# Patient Record
Sex: Female | Born: 1995 | Race: White | Hispanic: No | Marital: Single | Smoking: Current every day smoker
Health system: Southern US, Community
[De-identification: ages and names within clinical notes are randomized; demographics above are authoritative.]

## PROBLEM LIST (undated history)

## (undated) ENCOUNTER — Emergency Department (HOSPITAL_COMMUNITY): Admission: EM | Payer: MEDICAID | Source: Home / Self Care

## (undated) ENCOUNTER — Inpatient Hospital Stay (HOSPITAL_COMMUNITY): Payer: Self-pay

## (undated) DIAGNOSIS — R569 Unspecified convulsions: Secondary | ICD-10-CM

## (undated) DIAGNOSIS — J020 Streptococcal pharyngitis: Secondary | ICD-10-CM

## (undated) HISTORY — DX: Streptococcal pharyngitis: J02.0

## (undated) HISTORY — PX: NO PAST SURGERIES: SHX2092

## (undated) HISTORY — PX: OTHER SURGICAL HISTORY: SHX169

---
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
## 2015-01-09 DIAGNOSIS — F1910 Other psychoactive substance abuse, uncomplicated: Secondary | ICD-10-CM | POA: Insufficient documentation

## 2015-01-09 DIAGNOSIS — T1491XA Suicide attempt, initial encounter: Secondary | ICD-10-CM | POA: Insufficient documentation

## 2015-11-28 LAB — OB RESULTS CONSOLE GC/CHLAMYDIA
Chlamydia: NEGATIVE
Gonorrhea: NEGATIVE

## 2015-11-30 NOTE — L&D Delivery Note (Signed)
Delivery Note At 6:15 PM a viable female was delivered via Vaginal, Spontaneous Delivery (Presentation: ROA).  APGAR: 8, 9; weight pending.   Placenta status: intact, spontaneous.  Cord: 3 vessel. with the following complications: none.   Anesthesia: epidural Episiotomy: None Lacerations: Labial Suture Repair: 3.0 vicryl vicryl rapide Est. Blood Loss (mL):  Mom to postpartum.  Baby to Couplet care / Skin to Skin.  Ashley Harvey 06/28/2016, 6:55 PM  OB FELLOW DELIVERY ATTESTATION  I was gloved and present for the delivery in its entirety, and I agree with the above resident's note.    Ernestina Penna, MD 7:04 PM

## 2015-12-05 ENCOUNTER — Encounter (HOSPITAL_COMMUNITY): Payer: Self-pay

## 2015-12-05 ENCOUNTER — Inpatient Hospital Stay (HOSPITAL_COMMUNITY)
Admission: AD | Admit: 2015-12-05 | Discharge: 2015-12-10 | DRG: 781 | Disposition: A | Discharge status: Home / Self Care | Payer: BC Managed Care – PPO | Source: Ambulatory Visit | Attending: Obstetrics and Gynecology | Admitting: Obstetrics and Gynecology

## 2015-12-05 DIAGNOSIS — O99281 Endocrine, nutritional and metabolic diseases complicating pregnancy, first trimester: Secondary | ICD-10-CM | POA: Diagnosis present

## 2015-12-05 DIAGNOSIS — O210 Mild hyperemesis gravidarum: Secondary | ICD-10-CM | POA: Diagnosis present

## 2015-12-05 DIAGNOSIS — Z87891 Personal history of nicotine dependence: Secondary | ICD-10-CM

## 2015-12-05 DIAGNOSIS — E876 Hypokalemia: Secondary | ICD-10-CM | POA: Diagnosis present

## 2015-12-05 DIAGNOSIS — E860 Dehydration: Secondary | ICD-10-CM | POA: Diagnosis present

## 2015-12-05 DIAGNOSIS — Z6824 Body mass index (BMI) 24.0-24.9, adult: Secondary | ICD-10-CM

## 2015-12-05 DIAGNOSIS — E43 Unspecified severe protein-calorie malnutrition: Secondary | ICD-10-CM | POA: Insufficient documentation

## 2015-12-05 DIAGNOSIS — O2511 Malnutrition in pregnancy, first trimester: Principal | ICD-10-CM | POA: Diagnosis present

## 2015-12-05 DIAGNOSIS — Z3A09 9 weeks gestation of pregnancy: Secondary | ICD-10-CM

## 2015-12-05 DIAGNOSIS — R569 Unspecified convulsions: Secondary | ICD-10-CM | POA: Diagnosis present

## 2015-12-05 HISTORY — DX: Unspecified convulsions: R56.9

## 2015-12-05 LAB — LIPASE, BLOOD: Lipase: 27 U/L (ref 11–51)

## 2015-12-05 LAB — CBC WITH DIFFERENTIAL/PLATELET
BASOS ABS: 0 10*3/uL (ref 0.0–0.1)
BASOS PCT: 0 %
Eosinophils Absolute: 0 10*3/uL (ref 0.0–0.7)
Eosinophils Relative: 0 %
HEMATOCRIT: 42.1 % (ref 36.0–46.0)
HEMOGLOBIN: 15.1 g/dL — AB (ref 12.0–15.0)
LYMPHS PCT: 13 %
Lymphs Abs: 2.9 10*3/uL (ref 0.7–4.0)
MCH: 32.6 pg (ref 26.0–34.0)
MCHC: 35.9 g/dL (ref 30.0–36.0)
MCV: 90.9 fL (ref 78.0–100.0)
MONOS PCT: 5 %
Monocytes Absolute: 1.2 10*3/uL — ABNORMAL HIGH (ref 0.1–1.0)
NEUTROS ABS: 18.1 10*3/uL — AB (ref 1.7–7.7)
NEUTROS PCT: 82 %
Platelets: 366 10*3/uL (ref 150–400)
RBC: 4.63 MIL/uL (ref 3.87–5.11)
RDW: 13.3 % (ref 11.5–15.5)
WBC: 22.2 10*3/uL — ABNORMAL HIGH (ref 4.0–10.5)

## 2015-12-05 LAB — COMPREHENSIVE METABOLIC PANEL
ALBUMIN: 4.9 g/dL (ref 3.5–5.0)
ALK PHOS: 52 U/L (ref 38–126)
ALT: 25 U/L (ref 14–54)
AST: 32 U/L (ref 15–41)
Anion gap: 14 (ref 5–15)
BILIRUBIN TOTAL: 0.8 mg/dL (ref 0.3–1.2)
BUN: 11 mg/dL (ref 6–20)
CALCIUM: 10 mg/dL (ref 8.9–10.3)
CO2: 26 mmol/L (ref 22–32)
Chloride: 98 mmol/L — ABNORMAL LOW (ref 101–111)
Creatinine, Ser: 0.71 mg/dL (ref 0.44–1.00)
GFR calc Af Amer: >60 mL/min (ref >60)
GLUCOSE: 117 mg/dL — AB (ref 65–99)
Potassium: 3.6 mmol/L (ref 3.5–5.1)
Sodium: 138 mmol/L (ref 135–145)
Total Protein: 8.7 g/dL — ABNORMAL HIGH (ref 6.5–8.1)

## 2015-12-05 LAB — HCG, QUANTITATIVE, PREGNANCY: HCG, BETA CHAIN, QUANT, S: 147624 m[IU]/mL — AB (ref <5)

## 2015-12-05 LAB — AMYLASE: Amylase: 42 U/L (ref 28–100)

## 2015-12-05 MED ORDER — METHYLPREDNISOLONE 4 MG PO TABS
8.0000 mg | ORAL_TABLET | Freq: Every day | ORAL | Status: DC
  Administered 2015-12-09 – 2015-12-10 (×2): 8 mg via ORAL
  Filled 2015-12-05 (×3): qty 2

## 2015-12-05 MED ORDER — PROMETHAZINE HCL 25 MG/ML IJ SOLN
25.0000 mg | Freq: Once | INTRAMUSCULAR | Status: AC
  Administered 2015-12-05: 25 mg via INTRAVENOUS
  Filled 2015-12-05: qty 1

## 2015-12-05 MED ORDER — METHYLPREDNISOLONE 4 MG PO TABS: 4.0000 mg | ORAL_TABLET | Freq: Every day | ORAL | Status: DC

## 2015-12-05 MED ORDER — METHYLPREDNISOLONE 4 MG PO TABS
8.0000 mg | ORAL_TABLET | Freq: Every day | ORAL | Status: DC
  Administered 2015-12-10: 8 mg via ORAL
  Filled 2015-12-05 (×2): qty 2

## 2015-12-05 MED ORDER — METHYLPREDNISOLONE 4 MG PO TABS
8.0000 mg | ORAL_TABLET | Freq: Every day | ORAL | Status: DC
  Administered 2015-12-09: 8 mg via ORAL
  Filled 2015-12-05 (×2): qty 2

## 2015-12-05 MED ORDER — KCL IN DEXTROSE-NACL 10-5-0.45 MEQ/L-%-% IV SOLN
INTRAVENOUS | Status: DC
  Administered 2015-12-05 – 2015-12-08 (×9): via INTRAVENOUS
  Filled 2015-12-05 (×18): qty 1000

## 2015-12-05 MED ORDER — ACETAMINOPHEN 325 MG PO TABS: 650.0000 mg | ORAL_TABLET | ORAL | Status: DC | PRN

## 2015-12-05 MED ORDER — ALUM & MAG HYDROXIDE-SIMETH 200-200-20 MG/5ML PO SUSP: 30.0000 mL | ORAL | Status: DC | PRN

## 2015-12-05 MED ORDER — METHYLPREDNISOLONE 16 MG PO TABS
16.0000 mg | ORAL_TABLET | Freq: Every day | ORAL | Status: AC
  Filled 2015-12-05 (×3): qty 1

## 2015-12-05 MED ORDER — METHYLPREDNISOLONE 16 MG PO TABS
16.0000 mg | ORAL_TABLET | Freq: Every day | ORAL | Status: AC
  Filled 2015-12-05 (×2): qty 1

## 2015-12-05 MED ORDER — METHYLPREDNISOLONE SODIUM SUCC 125 MG IJ SOLR
48.0000 mg | Freq: Once | INTRAMUSCULAR | Status: AC
  Administered 2015-12-05: 48 mg via INTRAVENOUS
  Filled 2015-12-05: qty 0.77

## 2015-12-05 MED ORDER — METHYLPREDNISOLONE 4 MG PO TABS
4.0000 mg | ORAL_TABLET | Freq: Every day | ORAL | Status: DC
  Filled 2015-12-05: qty 1

## 2015-12-05 MED ORDER — KCL IN DEXTROSE-NACL 10-5-0.45 MEQ/L-%-% IV SOLN: INTRAVENOUS | Status: DC

## 2015-12-05 MED ORDER — LACTATED RINGERS IV SOLN
INTRAVENOUS | Status: DC
  Administered 2015-12-05: 19:00:00 via INTRAVENOUS

## 2015-12-05 MED ORDER — METHYLPREDNISOLONE 16 MG PO TABS
16.0000 mg | ORAL_TABLET | Freq: Every day | ORAL | Status: AC
  Filled 2015-12-05 (×4): qty 1

## 2015-12-05 MED ORDER — PROMETHAZINE HCL 25 MG/ML IJ SOLN
12.5000 mg | INTRAMUSCULAR | Status: DC | PRN
  Administered 2015-12-05 – 2015-12-07 (×5): 12.5 mg via INTRAVENOUS
  Filled 2015-12-05 (×5): qty 1

## 2015-12-05 NOTE — MAU Provider Note (Signed)
Chief Complaint: Seizures and Hyperemesis Gravidarum   First Provider Initiated Contact with Patient 12/05/15 1928        SUBJECTIVE HPI  Ashley Harvey is a 20 y.o. G1P0 at [redacted]w[redacted]d by LMP who presents to maternity admissions reporting Vomiting and possible seizures.  Marland KitchenHas been [facility] and had an [place] which showed a SIUP with heartbeat. Has been on Diclegis, Phenergan and Zofran with no relief. Stated to RN that she has been having Seizures for 2 mos  Has had a workup at [facility] including Head CT and EEG.  RN states she had a seizure-like episode on admission but was NOT postictal at all afterward, totally coherent and aware. She denies vaginal bleeding, vaginal itching/burning, urinary symptoms, h/a, dizziness, or fever/chills.    Hx is remarkable for Polysubstance abuse (MJ and Triple C's (Coricidin)) and recent admission to [facility] for suicide attempt. States her mom "is very happy about the baby" and her estranged boyfriend "is going to support [place]".   RN Note: Patient found out she was pregnant in Rehab for suicide attempt, having seizures x 2 months has not seen a neurologist yet, patient had seizure in lobby in wheelchair was brought back to room 10 in MAU, hyperemesis has not been able to keep anything down for 3 days.   Past Medical History  Diagnosis Date  . Seizures (HCC)    History reviewed. No pertinent past surgical history. Social History   Social History  . Marital Status: Single    Spouse Name: N/A  . Number of Children: N/A  . Years of Education: N/A   Occupational History  . Not on file.   Social History Main Topics  . Smoking status: Former Games developer  . Smokeless tobacco: Never Used  . Alcohol Use: Not on file  . Drug Use: No  . Sexual Activity: Not Currently    Birth Control/ Protection: None   Other Topics Concern  . Not on file   Social History Narrative  . No narrative on file   No current facility-administered medications on file  prior to encounter.   No current outpatient prescriptions on file prior to encounter.   Allergies  Allergen Reactions  . Vancomycin Anaphylaxis    ROS:  Review of Systems Review of Systems  Constitutional: Negative for fever and chills.  Gastrointestinal: Negative for diarrhea and constipation.   + for N/V and diffuse abdominal pain. Genitourinary: Negative for dysuria.  Musculoskeletal: Negative for back pain.  Neurological: Negative for dizziness and weakness.    I have reviewed patient's Past Medical Hx, Surgical Hx, Family Hx, Social Hx, medications and allergies.   Physical Exam  Patient Vitals for the past 24 hrs:  BP Temp Temp src Pulse Resp  12/05/15 1858 136/85 mmHg 98.2 F (36.8 C) Oral 79 20   Physical Exam  Constitutional: Well-developed, female in no acute distress, but actively vomiting. Cardiovascular: normal rate and rhythm Respiratory: normal effort, CTAB GI: Abd soft, tender throughout with NO rebound or guarding.Marland Kitchen Pos BS x 4 MS: Extremities nontender, no edema, normal ROM Neurologic: Alert and oriented x 4.  GU: Neg CVAT.  Bedside US done, SIUP seen with  FHR 150, normal fluid, CRL = 9w.0d   LAB RESULTS Results for orders placed or performed during the hospital encounter of 12/05/15 (from the past 24 hour(s))  CBC with Differential/Platelet     Status: Abnormal   Collection Time: 12/05/15  7:15 PM  Result Value Ref Range   WBC  22.2 (H) 4.0 - 10.5 K/uL   RBC 4.63 3.87 - 5.11 MIL/uL   Hemoglobin 15.1 (H) 12.0 - 15.0 g/dL   HCT 16.142.1 09.636.0 - 04.546.0 %   MCV 90.9 78.0 - 100.0 fL   MCH 32.6 26.0 - 34.0 pg   MCHC 35.9 30.0 - 36.0 g/dL   RDW 40.913.3 81.111.5 - 91.415.5 %   Platelets 366 150 - 400 K/uL   Neutrophils Relative % 82 %   Neutro Abs 18.1 (H) 1.7 - 7.7 K/uL   Lymphocytes Relative 13 %   Lymphs Abs 2.9 0.7 - 4.0 K/uL   Monocytes Relative 5 %   Monocytes Absolute 1.2 (H) 0.1 - 1.0 K/uL   Eosinophils Relative 0 %   Eosinophils Absolute 0.0 0.0 - 0.7 K/uL    Basophils Relative 0 %   Basophils Absolute 0.0 0.0 - 0.1 K/uL  Comprehensive metabolic panel     Status: Abnormal   Collection Time: 12/05/15  7:15 PM  Result Value Ref Range   Sodium 138 135 - 145 mmol/L   Potassium 3.6 3.5 - 5.1 mmol/L   Chloride 98 (L) 101 - 111 mmol/L   CO2 26 22 - 32 mmol/L   Glucose, Bld 117 (H) 65 - 99 mg/dL   BUN 11 6 - 20 mg/dL   Creatinine, Ser 7.820.71 0.44 - 1.00 mg/dL   Calcium 95.610.0 8.9 - 21.310.3 mg/dL   Total Protein 8.7 (H) 6.5 - 8.1 g/dL   Albumin 4.9 3.5 - 5.0 g/dL   AST 32 15 - 41 U/L   ALT 25 14 - 54 U/L   Alkaline Phosphatase 52 38 - 126 U/L   Total Bilirubin 0.8 0.3 - 1.2 mg/dL   GFR calc non Af Amer >60 >60 mL/min   GFR calc Af Amer >60 >60 mL/min   Anion gap 14 5 - 15  Amylase     Status: None   Collection Time: 12/05/15  7:15 PM  Result Value Ref Range   Amylase 42 28 - 100 U/L  Lipase, blood     Status: None   Collection Time: 12/05/15  7:15 PM  Result Value Ref Range   Lipase 27 11 - 51 U/L       IMAGING No results found.  MAU Management/MDM: Ordered labs and reviewed results.   Consult Dr Emelda FearFerguson.  Treatments in MAU included IV hydration and Phenergan.    ASSESSMENT SIUP at 4874w0d  Hyperemesis, failed three drugs Mild hypokalemia Seizure - Like activity, unknown cause  PLAN Admit per DR Emelda FearFerguson IV hydration with K+ Supplementation Phenergan PRN Start Medrol Protocol Patient and her mom want to transfer to [facility] (mom works there) Dr Emelda FearFerguson will discuss with them    Medication List    ASK your doctor about these medications        promethazine 25 MG tablet  Commonly known as:  PHENERGAN  Take 25 mg by mouth every 6 (six) hours as needed for nausea or vomiting.         Wynelle BourgeoisMarie Magen Suriano CNM, MSN Certified Nurse-Midwife 12/05/2015  7:58 PM

## 2015-12-05 NOTE — MAU Note (Signed)
Patient found out she was pregnant in Rehab for suicide attempt, having seizures x 2 months has not seen a neurologist yet, patient had seizure in lobby in wheelchair was brought back to room 10 in MAU, hyperemesis has not been able to keep anything down for 3 days.

## 2015-12-05 NOTE — H&P (Signed)
Chief Complaint: Seizures and Hyperemesis Gravidarum   First Provider Initiated Contact with Patient 12/05/15 1928        SUBJECTIVE HPI  Ashley Harvey is a 20 y.o. G1P0 at [redacted]w[redacted]d by LMP who presents to maternity admissions reporting Vomiting and possible seizures.  Marland KitchenHas been seen at [facility] and had an [place] which showed a SIUP with heartbeat. Has been on Diclegis, Phenergan and Zofran with no relief. Stated to RN that she has been having Seizures for 2 mos  Has had a workup at [facility] including Head CT and EEG.  RN states she had a seizure-like episode on admission but was NOT postictal at all afterward, totally coherent and aware. She denies vaginal bleeding, vaginal itching/burning, urinary symptoms, h/a, dizziness, or fever/chills.    Hx is remarkable for Polysubstance abuse (MJ and Triple C's (Coricidin)) and recent admission to [facility] for suicide attempt. States her mom "is very happy about the baby" and her estranged boyfriend "is going to support [place]".   RN Note: Patient found out she was pregnant in Rehab for suicide attempt, having seizures x 2 months has not seen a neurologist yet, patient had seizure in lobby in wheelchair was brought back to room 10 in MAU, hyperemesis has not been able to keep anything down for 3 days.   Past Medical History  Diagnosis Date  . Seizures (HCC)    History reviewed. No pertinent past surgical history. Social History   Social History  . Marital Status: Single    Spouse Name: N/A  . Number of Children: N/A  . Years of Education: N/A   Occupational History  . Not on file.   Social History Main Topics  . Smoking status: Former Games developer  . Smokeless tobacco: Never Used  . Alcohol Use: Not on file  . Drug Use: No  . Sexual Activity: Not Currently    Birth Control/ Protection: None   Other Topics Concern  . Not on file   Social History Narrative  . No narrative on file   No current facility-administered medications on file  prior to encounter.   No current outpatient prescriptions on file prior to encounter.   Allergies  Allergen Reactions  . Vancomycin Anaphylaxis    ROS:  Review of Systems Review of Systems  Constitutional: Negative for fever and chills.  Gastrointestinal: Negative for diarrhea and constipation.   + for N/V and diffuse abdominal pain. Genitourinary: Negative for dysuria.  Musculoskeletal: Negative for back pain.  Neurological: Negative for dizziness and weakness.    I have reviewed patient's Past Medical Hx, Surgical Hx, Family Hx, Social Hx, medications and allergies.   Physical Exam  Patient Vitals for the past 24 hrs:  BP Temp Temp src Pulse Resp  12/05/15 1858 136/85 mmHg 98.2 F (36.8 C) Oral 79 20   Physical Exam  Constitutional: Well-developed, female in no acute distress, but actively vomiting. Cardiovascular: normal rate and rhythm Respiratory: normal effort, CTAB GI: Abd soft, tender throughout with NO rebound or guarding.Marland Kitchen Pos BS x 4 MS: Extremities nontender, no edema, normal ROM Neurologic: Alert and oriented x 4.  GU: Neg CVAT.  Bedside US done, SIUP seen with  FHR 150, normal fluid, CRL = 9w.0d   LAB RESULTS Results for orders placed or performed during the hospital encounter of 12/05/15 (from the past 24 hour(s))  CBC with Differential/Platelet     Status: Abnormal   Collection Time: 12/05/15  7:15 PM  Result Value Ref Range   WBC  22.2 (H) 4.0 - 10.5 K/uL   RBC 4.63 3.87 - 5.11 MIL/uL   Hemoglobin 15.1 (H) 12.0 - 15.0 g/dL   HCT 16.1 09.6 - 04.5 %   MCV 90.9 78.0 - 100.0 fL   MCH 32.6 26.0 - 34.0 pg   MCHC 35.9 30.0 - 36.0 g/dL   RDW 40.9 81.1 - 91.4 %   Platelets 366 150 - 400 K/uL   Neutrophils Relative % 82 %   Neutro Abs 18.1 (H) 1.7 - 7.7 K/uL   Lymphocytes Relative 13 %   Lymphs Abs 2.9 0.7 - 4.0 K/uL   Monocytes Relative 5 %   Monocytes Absolute 1.2 (H) 0.1 - 1.0 K/uL   Eosinophils Relative 0 %   Eosinophils Absolute 0.0 0.0 - 0.7 K/uL    Basophils Relative 0 %   Basophils Absolute 0.0 0.0 - 0.1 K/uL  Comprehensive metabolic panel     Status: Abnormal   Collection Time: 12/05/15  7:15 PM  Result Value Ref Range   Sodium 138 135 - 145 mmol/L   Potassium 3.6 3.5 - 5.1 mmol/L   Chloride 98 (L) 101 - 111 mmol/L   CO2 26 22 - 32 mmol/L   Glucose, Bld 117 (H) 65 - 99 mg/dL   BUN 11 6 - 20 mg/dL   Creatinine, Ser 7.82 0.44 - 1.00 mg/dL   Calcium 95.6 8.9 - 21.3 mg/dL   Total Protein 8.7 (H) 6.5 - 8.1 g/dL   Albumin 4.9 3.5 - 5.0 g/dL   AST 32 15 - 41 U/L   ALT 25 14 - 54 U/L   Alkaline Phosphatase 52 38 - 126 U/L   Total Bilirubin 0.8 0.3 - 1.2 mg/dL   GFR calc non Af Amer >60 >60 mL/min   GFR calc Af Amer >60 >60 mL/min   Anion gap 14 5 - 15  Amylase     Status: None   Collection Time: 12/05/15  7:15 PM  Result Value Ref Range   Amylase 42 28 - 100 U/L  Lipase, blood     Status: None   Collection Time: 12/05/15  7:15 PM  Result Value Ref Range   Lipase 27 11 - 51 U/L       IMAGING No results found.  MAU Management/MDM: Ordered labs and reviewed results.   Consult Dr Emelda Fear.  Treatments in MAU included IV hydration and Phenergan.    ASSESSMENT SIUP at [redacted]w[redacted]d  Hyperemesis, failed three drugs Mild hypokalemia Seizure - Like activity, unknown cause  PLAN Admit per DR Emelda Fear IV hydration with K+ Supplementation Phenergan PRN Start Medrol Protocol Patient and her mom want to transfer to [facility] (mom works there) Dr Emelda Fear will discuss with them    Medication List    ASK your doctor about these medications        promethazine 25 MG tablet  Commonly known as:  PHENERGAN  Take 25 mg by mouth every 6 (six) hours as needed for nausea or vomiting.         Wynelle Bourgeois CNM, MSN Certified Nurse-Midwife 12/05/2015  7:58 PM

## 2015-12-06 DIAGNOSIS — E860 Dehydration: Secondary | ICD-10-CM | POA: Diagnosis present

## 2015-12-06 DIAGNOSIS — Z87891 Personal history of nicotine dependence: Secondary | ICD-10-CM | POA: Diagnosis not present

## 2015-12-06 DIAGNOSIS — R569 Unspecified convulsions: Secondary | ICD-10-CM | POA: Diagnosis not present

## 2015-12-06 DIAGNOSIS — Z3A09 9 weeks gestation of pregnancy: Secondary | ICD-10-CM

## 2015-12-06 DIAGNOSIS — O99281 Endocrine, nutritional and metabolic diseases complicating pregnancy, first trimester: Secondary | ICD-10-CM | POA: Diagnosis present

## 2015-12-06 DIAGNOSIS — E43 Unspecified severe protein-calorie malnutrition: Secondary | ICD-10-CM | POA: Diagnosis present

## 2015-12-06 DIAGNOSIS — O210 Mild hyperemesis gravidarum: Secondary | ICD-10-CM | POA: Diagnosis not present

## 2015-12-06 DIAGNOSIS — Z6824 Body mass index (BMI) 24.0-24.9, adult: Secondary | ICD-10-CM | POA: Diagnosis not present

## 2015-12-06 DIAGNOSIS — E876 Hypokalemia: Secondary | ICD-10-CM

## 2015-12-06 DIAGNOSIS — O2511 Malnutrition in pregnancy, first trimester: Secondary | ICD-10-CM | POA: Diagnosis present

## 2015-12-06 LAB — HEPATITIS B SURFACE ANTIGEN: HEP B S AG: NEGATIVE

## 2015-12-06 LAB — RAPID URINE DRUG SCREEN, HOSP PERFORMED
AMPHETAMINES: NOT DETECTED
Barbiturates: NOT DETECTED
Benzodiazepines: NOT DETECTED
Cocaine: NOT DETECTED
Opiates: NOT DETECTED
TETRAHYDROCANNABINOL: POSITIVE — AB

## 2015-12-06 LAB — DIFFERENTIAL
BASOS PCT: 0 %
Basophils Absolute: 0 10*3/uL (ref 0.0–0.1)
EOS ABS: 0 10*3/uL (ref 0.0–0.7)
EOS PCT: 0 %
Lymphocytes Relative: 15 %
Lymphs Abs: 3.4 10*3/uL (ref 0.7–4.0)
Monocytes Absolute: 1.6 10*3/uL — ABNORMAL HIGH (ref 0.1–1.0)
Monocytes Relative: 7 %
NEUTROS PCT: 78 %
Neutro Abs: 18.2 10*3/uL — ABNORMAL HIGH (ref 1.7–7.7)

## 2015-12-06 LAB — CBC
HCT: 38.2 % (ref 36.0–46.0)
Hemoglobin: 13.6 g/dL (ref 12.0–15.0)
MCH: 32.5 pg (ref 26.0–34.0)
MCHC: 35.6 g/dL (ref 30.0–36.0)
MCV: 91.2 fL (ref 78.0–100.0)
PLATELETS: 325 10*3/uL (ref 150–400)
RBC: 4.19 MIL/uL (ref 3.87–5.11)
RDW: 13.3 % (ref 11.5–15.5)
WBC: 23.2 10*3/uL — AB (ref 4.0–10.5)

## 2015-12-06 LAB — GLUCOSE, RANDOM: Glucose, Bld: 138 mg/dL — ABNORMAL HIGH (ref 65–99)

## 2015-12-06 LAB — HIV ANTIBODY (ROUTINE TESTING W REFLEX): HIV SCREEN 4TH GENERATION: NONREACTIVE

## 2015-12-06 MED ORDER — GLYCOPYRROLATE 1 MG PO TABS
2.0000 mg | ORAL_TABLET | Freq: Three times a day (TID) | ORAL | Status: DC | PRN
  Filled 2015-12-06: qty 2

## 2015-12-06 MED ORDER — METHYLPREDNISOLONE SODIUM SUCC 40 MG IJ SOLR
16.0000 mg | Freq: Once | INTRAMUSCULAR | Status: AC
  Administered 2015-12-06: 16 mg via INTRAVENOUS
  Filled 2015-12-06: qty 0.4

## 2015-12-06 MED ORDER — METOCLOPRAMIDE HCL 5 MG/ML IJ SOLN
10.0000 mg | Freq: Four times a day (QID) | INTRAMUSCULAR | Status: DC | PRN
  Administered 2015-12-06: 10 mg via INTRAVENOUS
  Filled 2015-12-06: qty 2

## 2015-12-06 NOTE — Progress Notes (Signed)
Subjective: admission for hyperemesis, mild dehydration with nausea, vomiting and spitting. Started on Methylprednisolone and Phenergan, will add reglan and robinul this a.m.  Patient reports nausea and vomiting.  Has voided once this a.m. Continues to vomit. Currently only on sips and chips  Objective: I have reviewed patient's vital signs, intake and output, medications and labs.  General: alert, cooperative, fatigued and no distress Resp: clear to auscultation bilaterally GI: normal findings: soft, non-tender, abnormal findings:  hypoactive bowel sounds and no guarding or rebound. CBC    Component Value Date/Time   WBC 22.2* 12/05/2015 1915   RBC 4.63 12/05/2015 1915   HGB 15.1* 12/05/2015 1915   HCT 42.1 12/05/2015 1915   PLT 366 12/05/2015 1915   MCV 90.9 12/05/2015 1915   MCH 32.6 12/05/2015 1915   MCHC 35.9 12/05/2015 1915   RDW 13.3 12/05/2015 1915   LYMPHSABS 2.9 12/05/2015 1915   MONOABS 1.2* 12/05/2015 1915   EOSABS 0.0 12/05/2015 1915   BASOSABS 0.0 12/05/2015 1915    UDS negative except for THC.  Assessment/Plan: Hyperemesis with spitting, on  Phenergan and methylprednisolone, will add robinul and reglan   Convert to admission OB panel ordered  Ashley Harvey V 12/06/2015, 7:28 AM

## 2015-12-07 DIAGNOSIS — O210 Mild hyperemesis gravidarum: Secondary | ICD-10-CM

## 2015-12-07 LAB — SYPHILIS: RPR W/REFLEX TO RPR TITER AND TREPONEMAL ANTIBODIES, TRADITIONAL SCREENING AND DIAGNOSIS ALGORITHM: RPR Ser Ql: NONREACTIVE

## 2015-12-07 LAB — TSH: TSH: 0.841 u[IU]/mL (ref 0.350–4.500)

## 2015-12-07 LAB — GLUCOSE, CAPILLARY: Glucose-Capillary: 93 mg/dL (ref 65–99)

## 2015-12-07 LAB — RUBELLA SCREEN: Rubella: 1.28 {index} (ref >0.99)

## 2015-12-07 MED ORDER — METOCLOPRAMIDE HCL 5 MG/ML IJ SOLN
10.0000 mg | Freq: Four times a day (QID) | INTRAMUSCULAR | Status: DC
  Administered 2015-12-07 – 2015-12-09 (×8): 10 mg via INTRAVENOUS
  Filled 2015-12-07 (×8): qty 2

## 2015-12-07 MED ORDER — GLYCOPYRROLATE 1 MG PO TABS
2.0000 mg | ORAL_TABLET | Freq: Three times a day (TID) | ORAL | Status: DC
  Administered 2015-12-07 – 2015-12-10 (×5): 2 mg via ORAL
  Filled 2015-12-07 (×13): qty 2

## 2015-12-07 MED ORDER — METHYLPREDNISOLONE SODIUM SUCC 40 MG IJ SOLR
16.0000 mg | Freq: Once | INTRAMUSCULAR | Status: AC
  Administered 2015-12-07: 16 mg via INTRAVENOUS
  Filled 2015-12-07: qty 0.4

## 2015-12-07 MED ORDER — PROMETHAZINE HCL 25 MG/ML IJ SOLN
12.5000 mg | Freq: Four times a day (QID) | INTRAMUSCULAR | Status: DC
  Administered 2015-12-07 – 2015-12-09 (×8): 12.5 mg via INTRAVENOUS
  Filled 2015-12-07 (×8): qty 1

## 2015-12-07 MED ORDER — METHYLPREDNISOLONE SODIUM SUCC 40 MG IJ SOLR
16.0000 mg | Freq: Once | INTRAMUSCULAR | Status: AC
  Administered 2015-12-07: 16 mg via INTRAVENOUS
  Filled 2015-12-07 (×2): qty 0.4

## 2015-12-07 MED ORDER — ONDANSETRON HCL 4 MG/2ML IJ SOLN
4.0000 mg | Freq: Four times a day (QID) | INTRAMUSCULAR | Status: DC | PRN
  Administered 2015-12-07 – 2015-12-10 (×5): 4 mg via INTRAVENOUS
  Filled 2015-12-07 (×5): qty 2

## 2015-12-07 NOTE — Plan of Care (Signed)
Problem: Fluid Volume: Goal: Ability to maintain a balanced intake and output will improve Outcome: Not Progressing Patient tried to advance to a more solid diet and then before eating vomited.  Problem: Nutrition: Goal: Adequate nutrition will be maintained Outcome: Not Progressing Continues to have nausea and vomiting.

## 2015-12-07 NOTE — Plan of Care (Signed)
Problem: Bowel/Gastric: Goal: Will not experience complications related to bowel motility Outcome: Completed/Met Date Met:  12/07/15 Patient had a BM on 12/06/15

## 2015-12-07 NOTE — Progress Notes (Signed)
Pt took her scheduled Robinul, but vomited about 5 minutes after she took them.

## 2015-12-07 NOTE — Progress Notes (Signed)
Patient ID: Ashley Harvey, female   DOB: 1996/08/16, 20 y.o.   MRN: [id number]   Subjective: Interval History:continues to retch after eating anything. Still spitting.  On Phenergan prn, Reglan prn and robinul prn and medrol taper. Weight is up 2 lbs since admission.  Objective: Vital signs in last 24 hours: Temp:  [98.3 F (36.8 C)-98.7 F (37.1 C)] 98.3 F (36.8 C) (01/08 0500) Pulse Rate:  [69-85] 74 (01/08 0500) Resp:  [15-16] 16 (01/08 0500) BP: (110-134)/(63-77) 110/68 mmHg (01/08 0500) SpO2:  [99 %-100 %] 99 % (01/08 0500) Weight:  [136 lb 4 oz (61.803 kg)] 136 lb 4 oz (61.803 kg) (01/08 0500)  Intake/Output from previous day: 01/07 0701 - 01/08 0700 In: 3615 [P.O.:150; I.V.:3465] Out: 380 [Urine:350] Intake/Output this shift:    General appearance: alert, cooperative and appears stated age Neck: supple, symmetrical, trachea midline Lungs: normal effort Abdomen: soft, non-tender; bowel sounds normal; no masses,  no organomegaly Extremities: Homans sign is negative, no sign of DVT  Results for orders placed or performed during the hospital encounter of 12/05/15 (from the past 24 hour(s))  Hepatitis B surface antigen     Status: None   Collection Time: 12/06/15  7:55 AM  Result Value Ref Range   Hepatitis B Surface Ag Negative Negative  RPR     Status: None   Collection Time: 12/06/15  7:55 AM  Result Value Ref Range   RPR Ser Ql Non Reactive Non Reactive  Differential     Status: Abnormal   Collection Time: 12/06/15  7:55 AM  Result Value Ref Range   Neutrophils Relative % 78 %   Neutro Abs 18.2 (H) 1.7 - 7.7 K/uL   Lymphocytes Relative 15 %   Lymphs Abs 3.4 0.7 - 4.0 K/uL   Monocytes Relative 7 %   Monocytes Absolute 1.6 (H) 0.1 - 1.0 K/uL   Eosinophils Relative 0 %   Eosinophils Absolute 0.0 0.0 - 0.7 K/uL   Basophils Relative 0 %   Basophils Absolute 0.0 0.0 - 0.1 K/uL  CBC     Status: Abnormal   Collection Time: 12/06/15  7:55 AM  Result Value Ref  Range   WBC 23.2 (H) 4.0 - 10.5 K/uL   RBC 4.19 3.87 - 5.11 MIL/uL   Hemoglobin 13.6 12.0 - 15.0 g/dL   HCT 86.538.2 78.436.0 - 69.646.0 %   MCV 91.2 78.0 - 100.0 fL   MCH 32.5 26.0 - 34.0 pg   MCHC 35.6 30.0 - 36.0 g/dL   RDW 29.513.3 28.411.5 - 13.215.5 %   Platelets 325 150 - 400 K/uL  HIV antibody (routine testing) (NOT for [facility])     Status: None   Collection Time: 12/06/15  7:55 AM  Result Value Ref Range   HIV Screen 4th Generation wRfx Non Reactive Non Reactive  Glucose, capillary     Status: None   Collection Time: 12/07/15  4:43 AM  Result Value Ref Range   Glucose-Capillary 93 65 - 99 mg/dL    Scheduled Meds: . glycopyrrolate  2 mg Oral TID  . methylPREDNISolone  16 mg Oral Q breakfast   Followed by  . [START ON 12/10/2015] methylPREDNISolone  8 mg Oral Q breakfast   Followed by  . [START ON 12/17/2015] methylPREDNISolone  4 mg Oral Q breakfast  . methylPREDNISolone  16 mg Oral Q1400   Followed by  . [START ON 12/08/2015] methylPREDNISolone  8 mg Oral Q1400   Followed by  . [START ON 12/11/2015] methylPREDNISolone  4 mg Oral Q1400  . methylPREDNISolone  16 mg Oral QHS   Followed by  . [START ON 12/09/2015] methylPREDNISolone  8 mg Oral QHS   Followed by  . [START ON 12/12/2015] methylPREDNISolone  4 mg Oral QHS  . metoCLOPramide (REGLAN) injection  10 mg Intravenous 4 times per day  . promethazine  12.5 mg Intravenous Q6H   Continuous Infusions: . dextrose 5 % and 0.45 % NaCl with KCl 10 mEq/L 150 mL/hr at 12/07/15 0713  . lactated ringers Stopped (12/05/15 2109)   PRN Meds:acetaminophen, alum & mag hydroxide-simeth, ondansetron (ZOFRAN) IV  Assessment/Plan: Patient Active Problem List   Diagnosis Date Noted  . Hyperemesis affecting pregnancy, antepartum 12/05/2015   No significant improvement--will schedule meds, add Zofran prn, continue IVF, check TSH   LOS: 1 day   Carrel Leather S, MD 12/07/2015 7:40 AM

## 2015-12-08 LAB — GLUCOSE, CAPILLARY: GLUCOSE-CAPILLARY: 106 mg/dL — AB (ref 65–99)

## 2015-12-08 MED ORDER — DEXTROSE IN LACTATED RINGERS 5 % IV SOLN
INTRAVENOUS | Status: DC
  Administered 2015-12-08 – 2015-12-09 (×6): via INTRAVENOUS
  Administered 2015-12-10: 125 mL/h via INTRAVENOUS

## 2015-12-08 MED ORDER — METHYLPREDNISOLONE SODIUM SUCC 40 MG IJ SOLR
16.0000 mg | Freq: Once | INTRAMUSCULAR | Status: AC
  Administered 2015-12-08: 16 mg via INTRAVENOUS
  Filled 2015-12-08: qty 0.4

## 2015-12-08 MED ORDER — PYRIDOXINE HCL 100 MG/ML IJ SOLN
100.0000 mg | Freq: Every day | INTRAMUSCULAR | Status: DC
  Administered 2015-12-08 – 2015-12-10 (×3): 100 mg via INTRAVENOUS
  Filled 2015-12-08 (×4): qty 1

## 2015-12-08 MED ORDER — METHYLPREDNISOLONE SODIUM SUCC 40 MG IJ SOLR
8.0000 mg | Freq: Once | INTRAMUSCULAR | Status: AC
  Administered 2015-12-08: 8 mg via INTRAVENOUS
  Filled 2015-12-08: qty 0.2

## 2015-12-08 NOTE — Progress Notes (Addendum)
Initial Nutrition Assessment  DOCUMENTATION CODES:  Severe malnutrition in context of acute illness/injury  INTERVENTION:  Bland Diet, small frequent meals Add Ensure or Resource Boost Breeze when pt can tolerate liquids  NUTRITION DIAGNOSIS:   Unintentional weight loss related to nausea, vomiting as evidenced by percent weight loss.  GOAL:  Patient will meet greater than or equal to 90% of their needs  MONITOR:  PO intake, Weight trends  REASON FOR ASSESSMENT:  Other (Comment) (Hyperemesis)   ASSESSMENT:   9 3/7 weeks IUP. 6 th day of no po intake. Unable to tolerate bland diet/ beverages at time of RD visit. Reports pre-pregnancy weight of 145 Lbs. Pt did not feel that she would keep Ensure of Resource down at this time. Meets ASPEN criteria for malnutrition based on weight loss (7.6%) and number of days without po intake.( > 5 days )  Diet Order:  Diet regular Room service appropriate?: Yes; Fluid consistency:: Thin  Skin:  Reviewed, no issues  Height:   Ht Readings from Last 1 Encounters:  12/05/15 5\' 2"  (1.575 m) (19 %*, Z = -0.89)   * Growth percentiles are based on CDC 2-20 Years data.   Weight:  Pre-pregnancy weight 145 lbs Wt Readings from Last 1 Encounters:  12/08/15 134 lb 8 oz (61.009 kg) (64 %*, Z = 0.35)   * Growth percentiles are based on CDC 2-20 Years data.    Ideal Body Weight:  50 kg  BMI:  Body mass index is 24.59 kg/(m^2). pre-pregnancy BMI 26.6  Estimated Nutritional Needs:  Kcal:  1600-1800 Protein:  72-82 g Fluid:  2 L  EDUCATION NEEDS: Pt provided with copy of diet for hyperemesis. Briefly reviewed diet with pt.    Elisabeth CaraKatherine Aleyda Gindlesperger M.Odis LusterEd. R.D. LDN Neonatal Nutrition Support Specialist/RD III Pager [phone]      Phone [phone]

## 2015-12-08 NOTE — Progress Notes (Signed)
Patient ID: Ashley Harvey, female   DOB: 12/10/1995, 20 y.o.   MRN: [id number]   Subjective: Patient reports feeling overall better but with persistent nausea and emesis. She was not able to tolerate any intake yesterday. She denies any abdominal cramping or vaginal bleeding  Objective: Vital signs in last 24 hours: Temp:  [98.2 F (36.8 C)-99.1 F (37.3 C)] 98.3 F (36.8 C) (01/09 0558) Pulse Rate:  [70-76] 70 (01/09 0558) Resp:  [16-18] 16 (01/09 0558) BP: (100-126)/(59-84) 115/66 mmHg (01/09 0558) SpO2:  [99 %-100 %] 100 % (01/09 0558) Weight:  [134 lb 8 oz (61.009 kg)] 134 lb 8 oz (61.009 kg) (01/09 0505)  Intake/Output from previous day: 01/08 0701 - 01/09 0700 In: 2776.5 [P.O.:139; I.V.:2637.5] Out: 725 [Urine:725] Intake/Output this shift:    General appearance: alert, cooperative and appears stated age Neck: supple, symmetrical, trachea midline Lungs: normal effort Abdomen: soft, non-tender; bowel sounds normal; no masses,  no organomegaly Extremities: Homans sign is negative, no sign of DVT  Results for orders placed or performed during the hospital encounter of 12/05/15 (from the past 24 hour(s))  Glucose, capillary     Status: Abnormal   Collection Time: 12/08/15  6:17 AM  Result Value Ref Range   Glucose-Capillary 106 (H) 65 - 99 mg/dL    Scheduled Meds: . glycopyrrolate  2 mg Oral TID  . methylPREDNISolone  16 mg Oral Q breakfast   Followed by  . [START ON 12/10/2015] methylPREDNISolone  8 mg Oral Q breakfast   Followed by  . [START ON 12/17/2015] methylPREDNISolone  4 mg Oral Q breakfast  . methylPREDNISolone  16 mg Oral Q1400   Followed by  . methylPREDNISolone  8 mg Oral Q1400   Followed by  . [START ON 12/11/2015] methylPREDNISolone  4 mg Oral Q1400  . methylPREDNISolone  16 mg Oral QHS   Followed by  . [START ON 12/09/2015] methylPREDNISolone  8 mg Oral QHS   Followed by  . [START ON 12/12/2015] methylPREDNISolone  4 mg Oral QHS  . metoCLOPramide  (REGLAN) injection  10 mg Intravenous 4 times per day  . promethazine  12.5 mg Intravenous Q6H   Continuous Infusions: . dextrose 5 % and 0.45 % NaCl with KCl 10 mEq/L 150 mL/hr at 12/08/15 0358  . lactated ringers Stopped (12/05/15 2109)   PRN Meds:acetaminophen, alum & mag hydroxide-simeth, ondansetron (ZOFRAN) IV  Assessment/Plan: Patient Active Problem List   Diagnosis Date Noted  . Hyperemesis affecting pregnancy, antepartum 12/05/2015   Continue IV fluid and antiemetic regimen Continue steroid taper Will add vitamin B6 to IV fluid Encouraged po challenge of bland food   LOS: 2 days   Trystan Akhtar, MD 12/08/2015 7:29 AM

## 2015-12-09 DIAGNOSIS — E43 Unspecified severe protein-calorie malnutrition: Secondary | ICD-10-CM | POA: Insufficient documentation

## 2015-12-09 LAB — GLUCOSE, CAPILLARY: GLUCOSE-CAPILLARY: 97 mg/dL (ref 65–99)

## 2015-12-09 MED ORDER — METHYLPREDNISOLONE SODIUM SUCC 40 MG IJ SOLR
16.0000 mg | Freq: Once | INTRAMUSCULAR | Status: AC
  Administered 2015-12-09: 16 mg via INTRAVENOUS
  Filled 2015-12-09: qty 0.4

## 2015-12-09 MED ORDER — METOCLOPRAMIDE HCL 10 MG PO TABS: 10.0000 mg | ORAL_TABLET | Freq: Four times a day (QID) | ORAL | Status: DC | PRN

## 2015-12-09 MED ORDER — PROMETHAZINE HCL 25 MG PO TABS
12.5000 mg | ORAL_TABLET | Freq: Four times a day (QID) | ORAL | Status: DC
  Administered 2015-12-09 – 2015-12-10 (×6): 12.5 mg via ORAL
  Filled 2015-12-09 (×6): qty 1

## 2015-12-09 NOTE — Progress Notes (Signed)
Patient ID: Ashley Harvey, female   DOB: 10/28/1996, 20 y.o.   MRN: [id number]  FACULTY PRACTICE ANTEPARTUM NOTE  Ashley Harvey is a 20 y.o. G1P0 at 2667w4d  who is admitted for hyperemesis.    Length of Stay:  3  Days  Subjective: Patient's appetite has improved.  Ate crackers last night, eating breakfast this morning.  Very little nausea.  Thinks medications are finally working.  She reports no bleeding  She reports no loss of fluid per vagina.  Vitals:  Blood pressure 95/51, pulse 73, temperature 98.2 F (36.8 C), temperature source Oral, resp. rate 14, height 5\' 2"  (1.575 m), weight 126 lb (57.153 kg), SpO2 99 %. Physical Examination:  General appearance - alert, well appearing, and in no distress Chest - clear to auscultation, no wheezes, rales or rhonchi, symmetric air entry Heart - normal rate, regular rhythm, normal S1, S2, no murmurs, rubs, clicks or gallops Abdomen - soft, nontender, nondistended, no masses or organomegaly Extremities: extremities normal, atraumatic, no cyanosis or edema with DTRs   Labs:  Results for orders placed or performed during the hospital encounter of 12/05/15 (from the past 24 hour(s))  Glucose, capillary   Collection Time: 12/09/15  5:53 AM  Result Value Ref Range   Glucose-Capillary 97 65 - 99 mg/dL    Medications:  Scheduled . glycopyrrolate  2 mg Oral TID  . methylPREDNISolone  16 mg Oral Q breakfast   Followed by  . [START ON 12/10/2015] methylPREDNISolone  8 mg Oral Q breakfast   Followed by  . [START ON 12/17/2015] methylPREDNISolone  4 mg Oral Q breakfast  . methylPREDNISolone  16 mg Oral QHS   Followed by  . methylPREDNISolone  8 mg Oral QHS   Followed by  . [START ON 12/12/2015] methylPREDNISolone  4 mg Oral QHS  . methylPREDNISolone  8 mg Oral Q1400   Followed by  . [START ON 12/11/2015] methylPREDNISolone  4 mg Oral Q1400  . promethazine  12.5 mg Oral Q6H  . pyridOXINE  100 mg Intravenous Daily   I have reviewed the  patient's current medications.  ASSESSMENT: Patient Active Problem List   Diagnosis Date Noted  . Protein-calorie malnutrition, severe 12/09/2015  . Hyperemesis affecting pregnancy, antepartum 12/05/2015    PLAN: 1.  Hyperemsis Gravidum  Transition phenergan and reglan to PO.  Continue medrol taper, robinul, and B6  If continues to do well on oral medications, D/c later today vs tomorrow. Continue routine antenatal care.   Rhona RaiderJacob J Rossie Scarfone, DO 12/09/2015,7:44 AM

## 2015-12-10 LAB — GLUCOSE, CAPILLARY: Glucose-Capillary: 101 mg/dL — ABNORMAL HIGH (ref 65–99)

## 2015-12-10 MED ORDER — METOCLOPRAMIDE HCL 10 MG PO TABS: 10.0000 mg | ORAL_TABLET | Freq: Three times a day (TID) | ORAL | Status: DC

## 2015-12-10 MED ORDER — GLYCOPYRROLATE 1 MG PO TABS: 2.0000 mg | ORAL_TABLET | Freq: Three times a day (TID) | ORAL | Status: DC

## 2015-12-10 MED ORDER — METHYLPREDNISOLONE 4 MG PO TABS: ORAL_TABLET | ORAL | Status: DC

## 2015-12-10 NOTE — Progress Notes (Signed)
Teaching complete no nausea and vomiting   Noted  Waiting for ride

## 2015-12-10 NOTE — Discharge Summary (Signed)
Physician Discharge Summary  Patient ID: Ashley Harvey MRN: [id number] DOB/AGE: 04-18-1996 20 y.o.  Admit date: 12/05/2015 Discharge date: 12/10/2015  Admission Diagnoses: Patient Active Problem List   Diagnosis Date Noted  . Protein-calorie malnutrition, severe 12/09/2015  . Hyperemesis affecting pregnancy, antepartum 12/05/2015     Discharge Diagnoses: same Active Problems:   Hyperemesis affecting pregnancy, antepartum   Protein-calorie malnutrition, severe   Discharged Condition: good  Hospital Course:  Chief Complaint: Seizures and Hyperemesis Gravidarum  First Provider Initiated Contact with Patient 12/05/15 1928     SUBJECTIVE HPI Ashley Harvey is a 20 y.o. G1P0 at [redacted]w[redacted]d by LMP who presents to maternity admissions reporting Vomiting and possible seizures. Mar[facility] and had an [place] which showed a SIUP with heartbeat. Has been on Diclegis, Phenergan and Zofran with no relief. Stated to RN that she has been having Seizures for 2 mos Has had a workup at [facility] including Head CT and EEG. RN states she had a seizure-like episode on admission but was NOT postictal at all afterward, totally coherent and aware. She denies vaginal bleeding, vaginal itching/burning, urinary symptoms, h/a, dizziness, or fever/chills.   Hx is remarkable for Polysubstance abuse (MJ and Triple C's (Coricidin)) and recent admission to [facility] for suicide attempt. States her mom "is very happy about the baby" and her estranged boyfriend "is going to support [place]".   RN Note: Patient found out she was pregnant in Rehab for suicide attempt, having seizures x 2 months has not seen a neurologist yet, patient had seizure in lobby in wheelchair was brought back to room 10 in MAU, hyperemesis has not been able to keep anything down for 3 days.   Past Medical History  Diagnosis Date  . Seizures (HCC)    History reviewed. No pertinent past surgical  history. Social History   Social History  . Marital Status: Single    Spouse Name: N/A  . Number of Children: N/A  . Years of Education: N/A   Occupational History  . Not on file.   Social History Main Topics  . Smoking status: Former Games developer  . Smokeless tobacco: Never Used  . Alcohol Use: Not on file  . Drug Use: No  . Sexual Activity: Not Currently    Birth Control/ Protection: None   Other Topics Concern  . Not on file   Social History Narrative  . No narrative on file   No current facility-administered medications on file prior to encounter.   No current outpatient prescriptions on file prior to encounter.   Allergies  Allergen Reactions  . Vancomycin Anaphylaxis    ROS:  Review of Systems Review of Systems  Constitutional: Negative for fever and chills.  Gastrointestinal: Negative for diarrhea and constipation.  + for N/V and diffuse abdominal pain. Genitourinary: Negative for dysuria.  Musculoskeletal: Negative for back pain.  Neurological: Negative for dizziness and weakness.    I have reviewed patient's Past Medical Hx, Surgical Hx, Family Hx, Social Hx, medications and allergies.   Physical Exam  Patient Vitals for the past 24 hrs:  BP Temp Temp src Pulse Resp  12/05/15 1858 136/85 mmHg 98.2 F (36.8 C) Oral 79 20   Physical Exam  Constitutional: Well-developed, female in no acute distress, but actively vomiting. Cardiovascular: normal rate and rhythm Respiratory: normal effort, CTAB GI: Abd soft, tender throughout with NO rebound or guarding.Marland Kitchen Pos BS x 4 MS: Extremities nontender, no edema, normal ROM Neurologic: Alert and oriented x 4.  GU: Neg CVAT.  Bedside US done, SIUP seen with FHR 150, normal fluid, CRL = 9w.0d   LAB RESULTS Results for orders placed or performed during the hospital encounter of 12/05/15 (from the past 24 hour(s))  CBC with  Differential/Platelet Status: Abnormal   Collection Time: 12/05/15 7:15 PM  Result Value Ref Range   WBC 22.2 (H) 4.0 - 10.5 K/uL   RBC 4.63 3.87 - 5.11 MIL/uL   Hemoglobin 15.1 (H) 12.0 - 15.0 g/dL   HCT 16.1 09.6 - 04.5 %   MCV 90.9 78.0 - 100.0 fL   MCH 32.6 26.0 - 34.0 pg   MCHC 35.9 30.0 - 36.0 g/dL   RDW 40.9 81.1 - 91.4 %   Platelets 366 150 - 400 K/uL   Neutrophils Relative % 82 %   Neutro Abs 18.1 (H) 1.7 - 7.7 K/uL   Lymphocytes Relative 13 %   Lymphs Abs 2.9 0.7 - 4.0 K/uL   Monocytes Relative 5 %   Monocytes Absolute 1.2 (H) 0.1 - 1.0 K/uL   Eosinophils Relative 0 %   Eosinophils Absolute 0.0 0.0 - 0.7 K/uL   Basophils Relative 0 %   Basophils Absolute 0.0 0.0 - 0.1 K/uL  Comprehensive metabolic panel Status: Abnormal   Collection Time: 12/05/15 7:15 PM  Result Value Ref Range   Sodium 138 135 - 145 mmol/L   Potassium 3.6 3.5 - 5.1 mmol/L   Chloride 98 (L) 101 - 111 mmol/L   CO2 26 22 - 32 mmol/L   Glucose, Bld 117 (H) 65 - 99 mg/dL   BUN 11 6 - 20 mg/dL   Creatinine, Ser 7.82 0.44 - 1.00 mg/dL   Calcium 95.6 8.9 - 21.3 mg/dL   Total Protein 8.7 (H) 6.5 - 8.1 g/dL   Albumin 4.9 3.5 - 5.0 g/dL   AST 32 15 - 41 U/L   ALT 25 14 - 54 U/L   Alkaline Phosphatase 52 38 - 126 U/L   Total Bilirubin 0.8 0.3 - 1.2 mg/dL   GFR calc non Af Amer >60 >60 mL/min   GFR calc Af Amer >60 >60 mL/min   Anion gap 14 5 - 15  Amylase Status: None   Collection Time: 12/05/15 7:15 PM  Result Value Ref Range   Amylase 42 28 - 100 U/L  Lipase, blood Status: None   Collection Time: 12/05/15 7:15 PM  Result Value Ref Range   Lipase 27 11 - 51 U/L      IMAGING No results found.  MAU Management/MDM: Ordered labs and reviewed results.  Consult Dr Emelda Fear. Treatments in MAU included  IV hydration and Phenergan.    ASSESSMENT SIUP at [redacted]w[redacted]d  Hyperemesis, failed three drugs Mild hypokalemia Seizure - Like activity, unknown cause  PLAN Admit per DR Emelda Fear IV hydration with K+ Supplementation Phenergan PRN Start Medrol Protocol Patient and her mom want to transfer to [facility] (mom works there) Dr Emelda Fear will discuss with them    Medication List    ASK your doctor about these medications       promethazine 25 MG tablet  Commonly known as: PHENERGAN  Take 25 mg by mouth every 6 (six) hours as needed for nausea or vomiting.         Wynelle Bourgeois CNM, MSN Certified Nurse-Midwife 12/05/2015  7:58 PM       Consults: None  Significant Diagnostic Studies:  CBC    Component Value Date/Time   WBC 23.2* 12/06/2015 0755  RBC 4.19 12/06/2015 0755   HGB 13.6 12/06/2015 0755   HCT 38.2 12/06/2015 0755   PLT 325 12/06/2015 0755   MCV 91.2 12/06/2015 0755   MCH 32.5 12/06/2015 0755   MCHC 35.6 12/06/2015 0755   RDW 13.3 12/06/2015 0755   LYMPHSABS 3.4 12/06/2015 0755   MONOABS 1.6* 12/06/2015 0755   EOSABS 0.0 12/06/2015 0755   BASOSABS 0.0 12/06/2015 0755      Treatments: IV hydration, steroids: medrol taper and Reglan and Robinal, Zofran  Discharge Exam: Blood pressure 107/64, pulse 76, temperature 98.4 F (36.9 C), temperature source Oral, resp. rate 16, height 5\' 2"  (1.575 m), weight 60.782 kg (134 lb), SpO2 99 %. General appearance: alert, cooperative and no distress GI: soft, non-tender; bowel sounds normal; no masses,  no organomegaly Extremities: extremities normal, atraumatic, no cyanosis or edema  Disposition: 01-Home or Self Care  Discharge Instructions    Discharge patient    Complete by:  As directed   To home            Medication List    TAKE these medications        glycopyrrolate 1 MG tablet  Commonly known as:  ROBINUL  Take 2 tablets (2 mg total) by mouth 3 (three) times daily.      methylPREDNISolone 4 MG tablet  Commonly known as:  MEDROL  2 tablets daily for 7 days then 1 a day for 7 days     metoCLOPramide 10 MG tablet  Commonly known as:  REGLAN  Take 1 tablet (10 mg total) by mouth 4 (four) times daily -  before meals and at bedtime.     ondansetron 4 MG disintegrating tablet  Commonly known as:  ZOFRAN-ODT  Take 4 mg by mouth.     promethazine 25 MG tablet  Commonly known as:  PHENERGAN  Take 25 mg by mouth every 6 (six) hours as needed for nausea or vomiting.           Follow-up Information    Follow up with [place] In 1 week.      Signed: ARNOLD,JAMES 12/10/2015, 8:52 AM

## 2015-12-10 NOTE — Progress Notes (Signed)
Out with tech

## 2015-12-10 NOTE — Discharge Instructions (Signed)
Eating Plan for Hyperemesis Gravidarum °Severe cases of hyperemesis gravidarum can lead to dehydration and malnutrition. The hyperemesis eating plan is one way to lessen the symptoms of nausea and vomiting. It is often used with prescribed medicines to control your symptoms.  °WHAT CAN I DO TO RELIEVE MY SYMPTOMS? °Listen to your body. Everyone is different and has different preferences. Find what works best for you. Some of the following things may help: °· Eat and drink slowly. °· Eat 5-6 small meals daily instead of 3 large meals.   °· Eat crackers before you get out of bed in the morning.   °· Starchy foods are usually well tolerated (such as cereal, toast, bread, potatoes, pasta, rice, and pretzels).   °· Ginger may help with nausea. Add ¼ tsp ground ginger to hot tea or choose ginger tea.   °· Try drinking 100% fruit juice or an electrolyte drink. °· Continue to take your prenatal vitamins as directed by your health care provider. If you are having trouble taking your prenatal vitamins, talk with your health care provider about different options. °· Include at least 1 serving of protein with your meals and snacks (such as meats or poultry, beans, nuts, eggs, or yogurt). Try eating a protein-rich snack before bed (such as cheese and crackers or a half turkey or peanut butter sandwich). °WHAT THINGS SHOULD I AVOID TO REDUCE MY SYMPTOMS? °The following things may help reduce your symptoms: °· Avoid foods with strong smells. Try eating meals in well-ventilated areas that are free of odors. °· Avoid drinking water or other beverages with meals. Try not to drink anything less than 30 minutes before and after meals. °· Avoid drinking more than 1 cup of fluid at a time. °· Avoid fried or high-fat foods, such as butter and cream sauces. °· Avoid spicy foods. °· Avoid skipping meals the best you can. Nausea can be more intense on an empty stomach. If you cannot tolerate food at that time, do not force it. Try sucking on  ice chips or other frozen items and make up the calories later. °· Avoid lying down within 2 hours after eating. °  °This information is not intended to replace advice given to you by your health care provider. Make sure you discuss any questions you have with your health care provider. °  °Document Released: 09/12/2007 Document Revised: 11/20/2013 Document Reviewed: 09/19/2013 °Elsevier Interactive Patient Education ©2016 Elsevier Inc. ° °

## 2016-01-05 ENCOUNTER — Inpatient Hospital Stay (HOSPITAL_COMMUNITY)
Admission: AD | Admit: 2016-01-05 | Discharge: 2016-01-05 | Disposition: A | Discharge status: Home / Self Care | Payer: BC Managed Care – PPO | Source: Ambulatory Visit | Attending: Obstetrics and Gynecology | Admitting: Obstetrics and Gynecology

## 2016-01-05 ENCOUNTER — Encounter (HOSPITAL_COMMUNITY): Payer: Self-pay | Admitting: *Deleted

## 2016-01-05 DIAGNOSIS — Z87891 Personal history of nicotine dependence: Secondary | ICD-10-CM | POA: Insufficient documentation

## 2016-01-05 DIAGNOSIS — O26892 Other specified pregnancy related conditions, second trimester: Secondary | ICD-10-CM | POA: Insufficient documentation

## 2016-01-05 DIAGNOSIS — Z3A22 22 weeks gestation of pregnancy: Secondary | ICD-10-CM | POA: Diagnosis not present

## 2016-01-05 DIAGNOSIS — R112 Nausea with vomiting, unspecified: Secondary | ICD-10-CM | POA: Diagnosis not present

## 2016-01-05 DIAGNOSIS — O219 Vomiting of pregnancy, unspecified: Secondary | ICD-10-CM

## 2016-01-05 DIAGNOSIS — R1110 Vomiting, unspecified: Secondary | ICD-10-CM | POA: Diagnosis present

## 2016-01-05 LAB — COMPREHENSIVE METABOLIC PANEL
ALK PHOS: 39 U/L (ref 38–126)
ALT: 15 U/L (ref 14–54)
ANION GAP: 10 (ref 5–15)
AST: 20 U/L (ref 15–41)
Albumin: 3.7 g/dL (ref 3.5–5.0)
BILIRUBIN TOTAL: 0.6 mg/dL (ref 0.3–1.2)
BUN: 9 mg/dL (ref 6–20)
CALCIUM: 8.7 mg/dL — AB (ref 8.9–10.3)
CO2: 23 mmol/L (ref 22–32)
CREATININE: 0.59 mg/dL (ref 0.44–1.00)
Chloride: 101 mmol/L (ref 101–111)
Glucose, Bld: 226 mg/dL — ABNORMAL HIGH (ref 65–99)
Potassium: 3.8 mmol/L (ref 3.5–5.1)
SODIUM: 134 mmol/L — AB (ref 135–145)
TOTAL PROTEIN: 6.9 g/dL (ref 6.5–8.1)

## 2016-01-05 LAB — URINALYSIS, ROUTINE W REFLEX MICROSCOPIC
Bilirubin Urine: NEGATIVE
GLUCOSE, UA: 500 mg/dL — AB
HGB URINE DIPSTICK: NEGATIVE
KETONES UR: 40 mg/dL — AB
Leukocytes, UA: NEGATIVE
Nitrite: NEGATIVE
PROTEIN: NEGATIVE mg/dL
SPECIFIC GRAVITY, URINE: 1.01 (ref 1.005–1.030)
pH: 6 (ref 5.0–8.0)

## 2016-01-05 LAB — CBC
HCT: 36.8 % (ref 36.0–46.0)
Hemoglobin: 13.1 g/dL (ref 12.0–15.0)
MCH: 32.8 pg (ref 26.0–34.0)
MCHC: 35.6 g/dL (ref 30.0–36.0)
MCV: 92.2 fL (ref 78.0–100.0)
Platelets: 257 10*3/uL (ref 150–400)
RBC: 3.99 MIL/uL (ref 3.87–5.11)
RDW: 12.9 % (ref 11.5–15.5)
WBC: 11.6 10*3/uL — ABNORMAL HIGH (ref 4.0–10.5)

## 2016-01-05 MED ORDER — PROMETHAZINE HCL 25 MG PO TABS: 25.0000 mg | ORAL_TABLET | Freq: Four times a day (QID) | ORAL | Status: DC | PRN

## 2016-01-05 MED ORDER — GLYCOPYRROLATE 0.2 MG/ML IJ SOLN
0.1000 mg | Freq: Once | INTRAMUSCULAR | Status: AC
  Administered 2016-01-05: 0.1 mg via INTRAVENOUS
  Filled 2016-01-05: qty 0.5

## 2016-01-05 MED ORDER — M.V.I. ADULT IV INJ
Freq: Once | INTRAVENOUS | Status: AC
  Administered 2016-01-05: 15:00:00 via INTRAVENOUS
  Filled 2016-01-05: qty 10

## 2016-01-05 MED ORDER — DEXTROSE IN LACTATED RINGERS 5 % IV SOLN
25.0000 mg | Freq: Once | INTRAVENOUS | Status: AC
  Administered 2016-01-05: 25 mg via INTRAVENOUS
  Filled 2016-01-05: qty 1

## 2016-01-05 MED ORDER — ONDANSETRON 8 MG PO TBDP: 8.0000 mg | ORAL_TABLET | Freq: Three times a day (TID) | ORAL | Status: DC | PRN

## 2016-01-05 MED ORDER — SODIUM CHLORIDE 0.9 % IV SOLN
8.0000 mg | Freq: Once | INTRAVENOUS | Status: AC
  Administered 2016-01-05: 8 mg via INTRAVENOUS
  Filled 2016-01-05: qty 4

## 2016-01-05 MED ORDER — FAMOTIDINE IN NACL 20-0.9 MG/50ML-% IV SOLN
20.0000 mg | Freq: Once | INTRAVENOUS | Status: AC
  Administered 2016-01-05: 20 mg via INTRAVENOUS
  Filled 2016-01-05: qty 50

## 2016-01-05 NOTE — Discharge Instructions (Signed)
Eating Plan for Hyperemesis Gravidarum Severe cases of hyperemesis gravidarum can lead to dehydration and malnutrition. The hyperemesis eating plan is one way to lessen the symptoms of nausea and vomiting. It is often used with prescribed medicines to control your symptoms.  WHAT CAN I DO TO RELIEVE MY SYMPTOMS? Listen to your body. Everyone is different and has different preferences. Find what works best for you. Some of the following things may help:  Eat and drink slowly.  Eat 5-6 small meals daily instead of 3 large meals.   Eat crackers before you get out of bed in the morning.   Starchy foods are usually well tolerated (such as cereal, toast, bread, potatoes, pasta, rice, and pretzels).   Ginger may help with nausea. Add  tsp ground ginger to hot tea or choose ginger tea.   Try drinking 100% fruit juice or an electrolyte drink.  Continue to take your prenatal vitamins as directed by your health care provider. If you are having trouble taking your prenatal vitamins, talk with your health care provider about different options.  Include at least 1 serving of protein with your meals and snacks (such as meats or poultry, beans, nuts, eggs, or yogurt). Try eating a protein-rich snack before bed (such as cheese and crackers or a half [place] or peanut butter sandwich). WHAT THINGS SHOULD I AVOID TO REDUCE MY SYMPTOMS? The following things may help reduce your symptoms:  Avoid foods with strong smells. Try eating meals in well-ventilated areas that are free of odors.  Avoid drinking water or other beverages with meals. Try not to drink anything less than 30 minutes before and after meals.  Avoid drinking more than 1 cup of fluid at a time.  Avoid fried or high-fat foods, such as butter and cream sauces.  Avoid spicy foods.  Avoid skipping meals the best you can. Nausea can be more intense on an empty stomach. If you cannot tolerate food at that time, do not force it. Try sucking on  ice chips or other frozen items and make up the calories later.  Avoid lying down within 2 hours after eating.   This information is not intended to replace advice given to you by your health care provider. Make sure you discuss any questions you have with your health care provider.   Document Released: 09/12/2007 Document Revised: 11/20/2013 Document Reviewed: 09/19/2013 Elsevier Interactive Patient Education 2016 Elsevier Inc.    [facility]   Francoise Ceo      Phone: [phone]  [facility]     Phone: [phone]  [facility] [place]  Phone: [phone]  [facility] [place]  Phone: [phone]  [facility]    Phone: [phone]   [facility])    Phone: [phone]  [facility]    Phone: [phone]  [facility]    Phone: [phone]  [facility]    Phone: [phone]  [facility] Phone: [phone]   [facility]  Phone: [phone]  [facility]    Phone: [phone]  [facility]   Phone: [phone]  [facility]      Phone: [phone]  [facility]    Phone: [phone]  [facility]     Phone: [phone]

## 2016-01-05 NOTE — MAU Provider Note (Signed)
History     CSN: [id number]  Arrival date and time: 01/05/16 1226   First Provider Initiated Contact with Patient 01/05/16 1330         Chief Complaint  Patient presents with  . Emesis During Pregnancy   HPI  Ashley Harvey is a 20 y.o. G1P0 at [redacted]w[redacted]d who presents with vomiting.  Was previously admitted to the hospital x 5 days for hyperemesis. Discharge with antiemetics & steroid taper. Pt reports improvement in symptoms until 3 days ago. States unable to keep down food/fluids x 3 days.  Denies abdominal pain, urinary complaints, fever, diarrhea, or vaginal bleeding. Last BM 3 days ago since last able to eat.  Took phenergan 1 hour prior to arrival to MAU but immediately vomited. Has phenergan suppository rx but hasn't used it recently b/c she states it doesn't work for her.  States hx of seizure since overdose last year. Going to neurologist, next appt on 2/15. No meds. Only has seizure when she is "stressed or sick". Reports seizure this morning at 11 am that was witnessed by her sister. Per patient's mother, pt lost consciousness & was post ictal.   OB History    Gravida Para Term Preterm AB TAB SAB Ectopic Multiple Living   1               Past Medical History  Diagnosis Date  . Seizures [facility])     Past Surgical History  Procedure Laterality Date  . No past surgeries      Family History  Problem Relation Age of Onset  . Cancer Mother   . Hyperlipidemia Father   . Hypertension Father   . Diabetes Father   . Cancer Maternal Aunt   . Cancer Maternal Uncle   . Cancer Maternal Grandmother   . Cancer Maternal Grandfather   . Diabetes Paternal Grandmother   . Hypertension Paternal Grandmother   . Hypertension Paternal Grandfather     Social History  Substance Use Topics  . Smoking status: Former Smoker    Quit date: 10/30/2015  . Smokeless tobacco: Never Used  . Alcohol Use: No    Allergies:  Allergies  Allergen Reactions  . Vancomycin Anaphylaxis     Prescriptions prior to admission  Medication Sig Dispense Refill Last Dose  . glycopyrrolate (ROBINUL) 1 MG tablet Take 2 tablets (2 mg total) by mouth 3 (three) times daily. 30 tablet 1   . methylPREDNISolone (MEDROL) 4 MG tablet 2 tablets daily for 7 days then 1 a day for 7 days 21 tablet 0   . metoCLOPramide (REGLAN) 10 MG tablet Take 1 tablet (10 mg total) by mouth 4 (four) times daily -  before meals and at bedtime. 40 tablet 1   . ondansetron (ZOFRAN-ODT) 4 MG disintegrating tablet Take 4 mg by mouth.   12/05/2015 at Unknown time  . promethazine (PHENERGAN) 25 MG tablet Take 25 mg by mouth every 6 (six) hours as needed for nausea or vomiting.   12/04/2015 at Unknown time    Review of Systems  Constitutional: Negative.   HENT: Negative.   Gastrointestinal: Positive for nausea and vomiting. Negative for heartburn, abdominal pain, diarrhea and constipation.  Genitourinary: Negative.   Neurological: Positive for seizures. Negative for dizziness, tingling, tremors, sensory change and speech change.   Physical Exam   Blood pressure 100/55, pulse 68, temperature 98.1 F (36.7 C), temperature source Oral, resp. rate 16, height  (1.575 m), weight 139 lb 9.6 oz (63.322 kg), SpO2 97 %.  Physical Exam  Nursing note and vitals reviewed. Constitutional: She is oriented to person, place, and time. She appears well-developed and well-nourished. No distress.  HENT:  Head: Normocephalic and atraumatic.  Eyes: Conjunctivae are normal. Right eye exhibits no discharge. Left eye exhibits no discharge. No scleral icterus.  Neck: Normal range of motion.  Cardiovascular: Normal rate, regular rhythm and normal heart sounds.   No murmur heard. Respiratory: Effort normal and breath sounds normal. No respiratory distress. She has no wheezes.  GI: Soft. There is no tenderness.  Neurological: She is alert and oriented to person, place, and time.  Skin: Skin is warm and dry. She is not diaphoretic.   Psychiatric: She has a normal mood and affect. Her behavior is normal. Judgment and thought content normal.    MAU Course  Procedures Results for orders placed or performed during the hospital encounter of 01/05/16 (from the past 24 hour(s))  CBC     Status: Abnormal   Collection Time: 01/05/16  2:24 PM  Result Value Ref Range   WBC 11.6 (H) 4.0 - 10.5 K/uL   RBC 3.99 3.87 - 5.11 MIL/uL   Hemoglobin 13.1 12.0 - 15.0 g/dL   HCT 16.1 09.6 - 04.5 %   MCV 92.2 78.0 - 100.0 fL   MCH 32.8 26.0 - 34.0 pg   MCHC 35.6 30.0 - 36.0 g/dL   RDW 40.9 81.1 - 91.4 %   Platelets 257 150 - 400 K/uL  Comprehensive metabolic panel     Status: Abnormal   Collection Time: 01/05/16  2:24 PM  Result Value Ref Range   Sodium 134 (L) 135 - 145 mmol/L   Potassium 3.8 3.5 - 5.1 mmol/L   Chloride 101 101 - 111 mmol/L   CO2 23 22 - 32 mmol/L   Glucose, Bld 226 (H) 65 - 99 mg/dL   BUN 9 6 - 20 mg/dL   Creatinine, Ser 7.82 0.44 - 1.00 mg/dL   Calcium 8.7 (L) 8.9 - 10.3 mg/dL   Total Protein 6.9 6.5 - 8.1 g/dL   Albumin 3.7 3.5 - 5.0 g/dL   AST 20 15 - 41 U/L   ALT 15 14 - 54 U/L   Alkaline Phosphatase 39 38 - 126 U/L   Total Bilirubin 0.6 0.3 - 1.2 mg/dL   GFR calc non Af Amer >60 >60 mL/min   GFR calc Af Amer >60 >60 mL/min   Anion gap 10 5 - 15  Urinalysis, Routine w reflex microscopic (not at [facility])     Status: Abnormal   Collection Time: 01/05/16  3:15 PM  Result Value Ref Range   Color, Urine YELLOW YELLOW   APPearance CLEAR CLEAR   Specific Gravity, Urine 1.010 1.005 - 1.030   pH 6.0 5.0 - 8.0   Glucose, UA 500 (A) NEGATIVE mg/dL   Hgb urine dipstick NEGATIVE NEGATIVE   Bilirubin Urine NEGATIVE NEGATIVE   Ketones, ur 40 (A) NEGATIVE mg/dL   Protein, ur NEGATIVE NEGATIVE mg/dL   Nitrite NEGATIVE NEGATIVE   Leukocytes, UA NEGATIVE NEGATIVE    MDM FHT 155 by doppler 5 lb weight gain since discharge from hospital 1/11 Urinalysis collected after bag of D5LR infused IV fluid bolus with  D5LR mixed with phenergan 25 mg followed by bag of MVI in LR IV robinul, pepcid, & zofran.  Pt reports improvement in symptoms & able to tolerate ginger ale & ice.  Per Care everywhere, pt has next appt with neurologist on 2/15 Plans on getting care  with [facility]  Assessment and Plan  A: 1. Nausea and vomiting during pregnancy prior to [redacted] weeks gestation     P: Discharge home  Start prenatal care Rx zofran Continue other antiemetics as prescribed Hyperemesis diet info given  Judeth Horn, NP  01/05/2016, 1:29 PM

## 2016-01-05 NOTE — MAU Note (Signed)
Patient has had ongoing n/v in pregnancy and was admitted a few weeks ago for IV fluids.  She presents today unable to keep any fluids down for 2 days.

## 2016-03-29 ENCOUNTER — Inpatient Hospital Stay (HOSPITAL_COMMUNITY)
Admission: AD | Admit: 2016-03-29 | Discharge: 2016-03-29 | Disposition: A | Discharge status: Home / Self Care | Payer: BC Managed Care – PPO | Source: Ambulatory Visit | Attending: Obstetrics and Gynecology | Admitting: Obstetrics and Gynecology

## 2016-03-29 ENCOUNTER — Encounter (HOSPITAL_COMMUNITY): Payer: Self-pay | Admitting: *Deleted

## 2016-03-29 DIAGNOSIS — Z87891 Personal history of nicotine dependence: Secondary | ICD-10-CM | POA: Insufficient documentation

## 2016-03-29 DIAGNOSIS — Z9119 Patient's noncompliance with other medical treatment and regimen: Secondary | ICD-10-CM | POA: Diagnosis not present

## 2016-03-29 DIAGNOSIS — E860 Dehydration: Secondary | ICD-10-CM | POA: Diagnosis present

## 2016-03-29 DIAGNOSIS — Z881 Allergy status to other antibiotic agents status: Secondary | ICD-10-CM | POA: Insufficient documentation

## 2016-03-29 DIAGNOSIS — O210 Mild hyperemesis gravidarum: Secondary | ICD-10-CM | POA: Diagnosis not present

## 2016-03-29 DIAGNOSIS — Z3A25 25 weeks gestation of pregnancy: Secondary | ICD-10-CM | POA: Diagnosis not present

## 2016-03-29 DIAGNOSIS — O0932 Supervision of pregnancy with insufficient antenatal care, second trimester: Secondary | ICD-10-CM

## 2016-03-29 LAB — URINALYSIS, ROUTINE W REFLEX MICROSCOPIC
GLUCOSE, UA: NEGATIVE mg/dL
Hgb urine dipstick: NEGATIVE
Ketones, ur: >80 mg/dL — AB
Nitrite: NEGATIVE
PROTEIN: 100 mg/dL — AB
SPECIFIC GRAVITY, URINE: 1.02 (ref 1.005–1.030)
pH: 5.5 (ref 5.0–8.0)

## 2016-03-29 LAB — URINE MICROSCOPIC-ADD ON: RBC / HPF: NONE SEEN RBC/hpf (ref 0–5)

## 2016-03-29 LAB — COMPREHENSIVE METABOLIC PANEL
ALBUMIN: 3.4 g/dL — AB (ref 3.5–5.0)
ALT: 62 U/L — ABNORMAL HIGH (ref 14–54)
AST: 55 U/L — AB (ref 15–41)
Alkaline Phosphatase: 58 U/L (ref 38–126)
Anion gap: 11 (ref 5–15)
BILIRUBIN TOTAL: 0.9 mg/dL (ref 0.3–1.2)
BUN: 7 mg/dL (ref 6–20)
CO2: 24 mmol/L (ref 22–32)
Calcium: 8.7 mg/dL — ABNORMAL LOW (ref 8.9–10.3)
Chloride: 96 mmol/L — ABNORMAL LOW (ref 101–111)
Creatinine, Ser: 0.55 mg/dL (ref 0.44–1.00)
GFR calc Af Amer: >60 mL/min (ref >60)
GFR calc non Af Amer: >60 mL/min (ref >60)
GLUCOSE: 77 mg/dL (ref 65–99)
POTASSIUM: 3 mmol/L — AB (ref 3.5–5.1)
Sodium: 131 mmol/L — ABNORMAL LOW (ref 135–145)
TOTAL PROTEIN: 7.2 g/dL (ref 6.5–8.1)

## 2016-03-29 LAB — CBC
HEMATOCRIT: 34.5 % — AB (ref 36.0–46.0)
HEMOGLOBIN: 12.1 g/dL (ref 12.0–15.0)
MCH: 31.5 pg (ref 26.0–34.0)
MCHC: 35.1 g/dL (ref 30.0–36.0)
MCV: 89.8 fL (ref 78.0–100.0)
Platelets: 285 10*3/uL (ref 150–400)
RBC: 3.84 MIL/uL — ABNORMAL LOW (ref 3.87–5.11)
RDW: 12.7 % (ref 11.5–15.5)
WBC: 16.2 10*3/uL — AB (ref 4.0–10.5)

## 2016-03-29 MED ORDER — METOCLOPRAMIDE HCL 5 MG/ML IJ SOLN
10.0000 mg | Freq: Once | INTRAMUSCULAR | Status: AC
  Administered 2016-03-29: 10 mg via INTRAVENOUS
  Filled 2016-03-29: qty 2

## 2016-03-29 MED ORDER — METOCLOPRAMIDE HCL 10 MG PO TABS: 10.0000 mg | ORAL_TABLET | Freq: Four times a day (QID) | ORAL | Status: DC | PRN

## 2016-03-29 MED ORDER — LACTATED RINGERS IV SOLN
Freq: Once | INTRAVENOUS | Status: AC
  Administered 2016-03-29: 21:00:00 via INTRAVENOUS
  Filled 2016-03-29: qty 10

## 2016-03-29 MED ORDER — SODIUM CHLORIDE 0.9 % IV SOLN
8.0000 mg | Freq: Once | INTRAVENOUS | Status: AC
  Administered 2016-03-29: 8 mg via INTRAVENOUS
  Filled 2016-03-29: qty 4

## 2016-03-29 NOTE — MAU Note (Signed)
Pt reports she has not been able to keep down any food or liquids for the last 8 days, has had hyperemesis throughout the pregnancy but meds are not staying down. States she has been hospitalized several times.

## 2016-03-29 NOTE — Discharge Instructions (Signed)
Hyperemesis Gravidarum °Hyperemesis gravidarum is a severe form of nausea and vomiting that happens during pregnancy. Hyperemesis is worse than morning sickness. It may cause you to have nausea or vomiting all day for many days. It may keep you from eating and drinking enough food and liquids. Hyperemesis usually occurs during the first half (the first 20 weeks) of pregnancy. It often goes away once a woman is in her second half of pregnancy. However, sometimes hyperemesis continues through an entire pregnancy.  °CAUSES  °The cause of this condition is not completely known but is thought to be related to changes in the body's hormones when pregnant. It could be from the high level of the pregnancy hormone or an increase in estrogen in the body.  °SIGNS AND SYMPTOMS  °· Severe nausea and vomiting. °· Nausea that does not go away. °· Vomiting that does not allow you to keep any food down. °· Weight loss and body fluid loss (dehydration). °· Having no desire to eat or not liking food you have previously enjoyed. °DIAGNOSIS  °Your health care provider will do a physical exam and ask you about your symptoms. He or she may also order blood tests and urine tests to make sure something else is not causing the problem.  °TREATMENT  °You may only need medicine to control the problem. If medicines do not control the nausea and vomiting, you will be treated in the hospital to prevent dehydration, increased acid in the blood (acidosis), weight loss, and changes in the electrolytes in your body that may harm the unborn baby (fetus). You may need IV fluids.  °HOME CARE INSTRUCTIONS  °· Only take over-the-counter or prescription medicines as directed by your health care provider. °· Try eating a couple of dry crackers or toast in the morning before getting out of bed. °· Avoid foods and smells that upset your stomach. °· Avoid fatty and spicy foods. °· Eat 5-6 small meals a day. °· Do not drink when eating meals. Drink between  meals. °· For snacks, eat high-protein foods, such as cheese. °· Eat or suck on things that have ginger in them. Ginger helps nausea. °· Avoid food preparation. The smell of food can spoil your appetite. °· Avoid iron pills and iron in your multivitamins until after 3-4 months of being pregnant. However, consult with your health care provider before stopping any prescribed iron pills. °SEEK MEDICAL CARE IF:  °· Your abdominal pain increases. °· You have a severe headache. °· You have vision problems. °· You are losing weight. °SEEK IMMEDIATE MEDICAL CARE IF:  °· You are unable to keep fluids down. °· You vomit blood. °· You have constant nausea and vomiting. °· You have excessive weakness. °· You have extreme thirst. °· You have dizziness or fainting. °· You have a fever or persistent symptoms for more than 2-3 days. °· You have a fever and your symptoms suddenly get worse. °MAKE SURE YOU:  °· Understand these instructions. °· Will watch your condition. °· Will get help right away if you are not doing well or get worse. °  °This information is not intended to replace advice given to you by your health care provider. Make sure you discuss any questions you have with your health care provider. °  °Document Released: 11/15/2005 Document Revised: 09/05/2013 Document Reviewed: 06/27/2013 °Elsevier Interactive Patient Education ©2016 [employer] °Eating Plan for Hyperemesis Gravidarum °Severe cases of hyperemesis gravidarum can lead to dehydration and malnutrition. The hyperemesis eating plan is one   way to lessen the symptoms of nausea and vomiting. It is often used with prescribed medicines to control your symptoms.  °WHAT CAN I DO TO RELIEVE MY SYMPTOMS? °Listen to your body. Everyone is different and has different preferences. Find what works best for you. Some of the following things may help: °· Eat and drink slowly. °· Eat 5-6 small meals daily instead of 3 large meals.   °· Eat crackers before you get out of bed  in the morning.   °· Starchy foods are usually well tolerated (such as cereal, toast, bread, potatoes, pasta, rice, and pretzels).   °· Ginger may help with nausea. Add ¼ tsp ground ginger to hot tea or choose ginger tea.   °· Try drinking 100% fruit juice or an electrolyte drink. °· Continue to take your prenatal vitamins as directed by your health care provider. If you are having trouble taking your prenatal vitamins, talk with your health care provider about different options. °· Include at least 1 serving of protein with your meals and snacks (such as meats or poultry, beans, nuts, eggs, or yogurt). Try eating a protein-rich snack before bed (such as cheese and crackers or a half turkey or peanut butter sandwich). °WHAT THINGS SHOULD I AVOID TO REDUCE MY SYMPTOMS? °The following things may help reduce your symptoms: °· Avoid foods with strong smells. Try eating meals in well-ventilated areas that are free of odors. °· Avoid drinking water or other beverages with meals. Try not to drink anything less than 30 minutes before and after meals. °· Avoid drinking more than 1 cup of fluid at a time. °· Avoid fried or high-fat foods, such as butter and cream sauces. °· Avoid spicy foods. °· Avoid skipping meals the best you can. Nausea can be more intense on an empty stomach. If you cannot tolerate food at that time, do not force it. Try sucking on ice chips or other frozen items and make up the calories later. °· Avoid lying down within 2 hours after eating. °  °This information is not intended to replace advice given to you by your health care provider. Make sure you discuss any questions you have with your health care provider. °  °Document Released: 09/12/2007 Document Revised: 11/20/2013 Document Reviewed: 09/19/2013 °Elsevier Interactive Patient Education ©2016 [employer] °Second Trimester of Pregnancy °The second trimester is from week 13 through week 28, months 4 through 6. The second trimester is often a time  when you feel your best. Your body has also adjusted to being pregnant, and you begin to feel better physically. Usually, morning sickness has lessened or quit completely, you may have more energy, and you may have an increase in appetite. The second trimester is also a time when the fetus is growing rapidly. At the end of the sixth month, the fetus is about 9 inches long and weighs about 1½ pounds. You will likely begin to feel the baby move (quickening) between 18 and 20 weeks of the pregnancy. °BODY CHANGES °Your body goes through many changes during pregnancy. The changes vary from woman to woman.  °· Your weight will continue to increase. You will notice your lower abdomen bulging out. °· You may begin to get stretch marks on your hips, abdomen, and breasts. °· You may develop headaches that can be relieved by medicines approved by your health care provider. °· You may urinate more often because the fetus is pressing on your bladder. °· You may develop or continue to have heartburn as a result of   your pregnancy. °· You may develop constipation because certain hormones are causing the muscles that push waste through your intestines to slow down. °· You may develop hemorrhoids or swollen, bulging veins (varicose veins). °· You may have back pain because of the weight gain and pregnancy hormones relaxing your joints between the bones in your pelvis and as a result of a shift in weight and the muscles that support your balance. °· Your breasts will continue to grow and be tender. °· Your gums may bleed and may be sensitive to brushing and flossing. °· Dark spots or blotches (chloasma, mask of pregnancy) may develop on your face. This will likely fade after the baby is born. °· A dark line from your belly button to the pubic area (linea nigra) may appear. This will likely fade after the baby is born. °· You may have changes in your hair. These can include thickening of your hair, rapid growth, and changes in  texture. Some women also have hair loss during or after pregnancy, or hair that feels dry or thin. Your hair will most likely return to normal after your baby is born. °WHAT TO EXPECT AT YOUR PRENATAL VISITS °During a routine prenatal visit: °· You will be weighed to make sure you and the fetus are growing normally. °· Your blood pressure will be taken. °· Your abdomen will be measured to track your baby's growth. °· The fetal heartbeat will be listened to. °· Any test results from the previous visit will be discussed. °Your health care provider may ask you: °· How you are feeling. °· If you are feeling the baby move. °· If you have had any abnormal symptoms, such as leaking fluid, bleeding, severe headaches, or abdominal cramping. °· If you are using any tobacco products, including cigarettes, chewing tobacco, and electronic cigarettes. °· If you have any questions. °Other tests that may be performed during your second trimester include: °· Blood tests that check for: °¨ Low iron levels (anemia). °¨ Gestational diabetes (between 24 and 28 weeks). °¨ Rh antibodies. °· Urine tests to check for infections, diabetes, or protein in the urine. °· An ultrasound to confirm the proper growth and development of the baby. °· An amniocentesis to check for possible genetic problems. °· Fetal screens for spina bifida and Down syndrome. °· HIV (human immunodeficiency virus) testing. Routine prenatal testing includes screening for HIV, unless you choose not to have this test. °HOME CARE INSTRUCTIONS  °· Avoid all smoking, herbs, alcohol, and unprescribed drugs. These chemicals affect the formation and growth of the baby. °· Do not use any tobacco products, including cigarettes, chewing tobacco, and electronic cigarettes. If you need help quitting, ask your health care provider. You may receive counseling support and other resources to help you quit. °· Follow your health care provider's instructions regarding medicine use. There  are medicines that are either safe or unsafe to take during pregnancy. °· Exercise only as directed by your health care provider. Experiencing uterine cramps is a good sign to stop exercising. °· Continue to eat regular, healthy meals. °· Wear a good support bra for breast tenderness. °· Do not use hot tubs, steam rooms, or saunas. °· Wear your seat belt at all times when driving. °· Avoid raw meat, uncooked cheese, cat litter boxes, and soil used by cats. These carry germs that can cause birth defects in the baby. °· Take your prenatal vitamins. °· Take 1500-2000 mg of calcium daily starting at the 20th week of pregnancy   until you deliver your baby. °· Try taking a stool softener (if your health care provider approves) if you develop constipation. Eat more high-fiber foods, such as fresh vegetables or fruit and whole grains. Drink plenty of fluids to keep your urine clear or pale yellow. °· Take warm sitz baths to soothe any pain or discomfort caused by hemorrhoids. Use hemorrhoid cream if your health care provider approves. °· If you develop varicose veins, wear support hose. Elevate your feet for 15 minutes, 3-4 times a day. Limit salt in your diet. °· Avoid heavy lifting, wear low heel shoes, and practice good posture. °· Rest with your legs elevated if you have leg cramps or low back pain. °· Visit your dentist if you have not gone yet during your pregnancy. Use a soft toothbrush to brush your teeth and be gentle when you floss. °· A sexual relationship may be continued unless your health care provider directs you otherwise. °· Continue to go to all your prenatal visits as directed by your health care provider. °SEEK MEDICAL CARE IF:  °· You have dizziness. °· You have mild pelvic cramps, pelvic pressure, or nagging pain in the abdominal area. °· You have persistent nausea, vomiting, or diarrhea. °· You have a bad smelling vaginal discharge. °· You have pain with urination. °SEEK IMMEDIATE MEDICAL CARE IF:   °· You have a fever. °· You are leaking fluid from your vagina. °· You have spotting or bleeding from your vagina. °· You have severe abdominal cramping or pain. °· You have rapid weight gain or loss. °· You have shortness of breath with chest pain. °· You notice sudden or extreme swelling of your face, hands, ankles, feet, or legs. °· You have not felt your baby move in over an hour. °· You have severe headaches that do not go away with medicine. °· You have vision changes. °  °This information is not intended to replace advice given to you by your health care provider. Make sure you discuss any questions you have with your health care provider. °  °Document Released: 11/09/2001 Document Revised: 12/06/2014 Document Reviewed: 01/16/2013 °Elsevier Interactive Patient Education ©2016 [employer] ° °

## 2016-03-29 NOTE — MAU Provider Note (Signed)
History    Ashley Harvey is a 19y.o. G1P0 at 25.3wks who presents, unannounced, for dehydration.  Patient states that she has not been able to keep food or drinks down for the last 8 days.  Patient states that she is taking phenergan 25mg  Q6 hours throughout the pregnancy.  Patient endorses fetal movement and cramping (that started yesterday), but denies VB and LoF.  Patient also denies recent intercourse.  Mother reports personal history of hyperemesis with NG tube in last pregnancy.  Patient states she can not tolerate the smell of foods. Patient reports diarrhea twice within the last week, but states this is the only two bowel movements she has had in the week.  Patient reports burning with urination and is currently on Keflex as prescribed last night in [place] [facility], but states she has not been able to keep them down. Patient reports most recent vomiting incident 4 hours ago.  Patient reports current nausea.      24hr Recall -Pepperoni -2 sips of coke -A Little bit of water -2 saltine crackers -Sips of water  Patient has not been seen at [facility] for last 9 weeks and has only had NOB interview and no other appts.  Patient states that she is told that she has a $400 balance despite pregnancy medicaid.  Patient informed that after review of her chart, no mention of pregnancy medicaid and primary insurance is [employer].  Patient states she has not been to [facility] because of this and has not sought treatment at any other office.   Patient Active Problem List   Diagnosis Date Noted  . Protein-calorie malnutrition, severe 12/09/2015  . Hyperemesis affecting pregnancy, antepartum 12/05/2015    No chief complaint on file.  HPI  OB History    Gravida Para Term Preterm AB TAB SAB Ectopic Multiple Living   1               Past Medical History  Diagnosis Date  . Seizures [facility])     Past Surgical History  Procedure Laterality Date  . No past surgeries      Family History  Problem Relation Age  of Onset  . Cancer Mother   . Hyperlipidemia Father   . Hypertension Father   . Diabetes Father   . Cancer Maternal Aunt   . Cancer Maternal Uncle   . Cancer Maternal Grandmother   . Cancer Maternal Grandfather   . Diabetes Paternal Grandmother   . Hypertension Paternal Grandmother   . Hypertension Paternal Grandfather     Social History  Substance Use Topics  . Smoking status: Former Smoker    Quit date: 10/30/2015  . Smokeless tobacco: Never Used  . Alcohol Use: No    Allergies:  Allergies  Allergen Reactions  . Vancomycin Anaphylaxis    Prescriptions prior to admission  Medication Sig Dispense Refill Last Dose  . cephALEXin (KEFLEX) 500 MG capsule Take 500 mg by mouth 4 (four) times daily - after meals and at bedtime. For 7 days   03/29/2016 at Unknown time  . Prenatal Vit-Fe Fumarate-FA (PRENATAL MULTIVITAMIN) TABS tablet Take 1 tablet by mouth daily at 12 noon.   03/29/2016 at Unknown time  . promethazine (PHENERGAN) 25 MG tablet Take 1 tablet (25 mg total) by mouth every 6 (six) hours as needed for nausea or vomiting. 30 tablet 0 03/29/2016 at Unknown time  . glycopyrrolate (ROBINUL) 1 MG tablet Take 2 tablets (2 mg total) by mouth 3 (three) times daily. (Patient not taking:  Reported on 03/29/2016) 30 tablet 1 Past Week at Unknown time  . metoCLOPramide (REGLAN) 10 MG tablet Take 1 tablet (10 mg total) by mouth 4 (four) times daily -  before meals and at bedtime. (Patient not taking: Reported on 01/05/2016) 40 tablet 1 Completed Course at Unknown time  . ondansetron (ZOFRAN ODT) 8 MG disintegrating tablet Take 1 tablet (8 mg total) by mouth every 8 (eight) hours as needed for nausea or vomiting. (Patient not taking: Reported on 03/29/2016) 20 tablet 0     ROS  See HPI Above Physical Exam   Blood pressure 127/71, pulse 79, temperature 98.8 F (37.1 C), temperature source Oral, resp. rate 18, height 5\' 2"  (1.575 m), weight 63.504 kg (140 lb), SpO2 98 %.  Results for orders placed  or performed during the hospital encounter of 03/29/16 (from the past 24 hour(s))  Urinalysis, Routine w reflex microscopic (not [facility])     Status: Abnormal   Collection Time: 03/29/16  7:27 PM  Result Value Ref Range   Color, Urine AMBER (A) YELLOW   APPearance CLEAR CLEAR   Specific Gravity, Urine 1.020 1.005 - 1.030   pH 5.5 5.0 - 8.0   Glucose, UA NEGATIVE NEGATIVE mg/dL   Hgb urine dipstick NEGATIVE NEGATIVE   Bilirubin Urine MODERATE (A) NEGATIVE   Ketones, ur >80 (A) NEGATIVE mg/dL   Protein, ur 478100 (A) NEGATIVE mg/dL   Nitrite NEGATIVE NEGATIVE   Leukocytes, UA MODERATE (A) NEGATIVE  Urine microscopic-add on     Status: Abnormal   Collection Time: 03/29/16  7:27 PM  Result Value Ref Range   Squamous Epithelial / LPF 6-30 (A) NONE SEEN   WBC, UA 6-30 0 - 5 WBC/hpf   RBC / HPF NONE SEEN 0 - 5 RBC/hpf   Bacteria, UA FEW (A) NONE SEEN   Urine-Other YEAST PRESENT   CBC     Status: Abnormal   Collection Time: 03/29/16  8:46 PM  Result Value Ref Range   WBC 16.2 (H) 4.0 - 10.5 K/uL   RBC 3.84 (L) 3.87 - 5.11 MIL/uL   Hemoglobin 12.1 12.0 - 15.0 g/dL   HCT 29.534.5 (L) 62.136.0 - 30.846.0 %   MCV 89.8 78.0 - 100.0 fL   MCH 31.5 26.0 - 34.0 pg   MCHC 35.1 30.0 - 36.0 g/dL   RDW 65.712.7 84.611.5 - 96.215.5 %   Platelets 285 150 - 400 K/uL  Comprehensive metabolic panel     Status: Abnormal   Collection Time: 03/29/16  8:46 PM  Result Value Ref Range   Sodium 131 (L) 135 - 145 mmol/L   Potassium 3.0 (L) 3.5 - 5.1 mmol/L   Chloride 96 (L) 101 - 111 mmol/L   CO2 24 22 - 32 mmol/L   Glucose, Bld 77 65 - 99 mg/dL   BUN 7 6 - 20 mg/dL   Creatinine, Ser 9.520.55 0.44 - 1.00 mg/dL   Calcium 8.7 (L) 8.9 - 10.3 mg/dL   Total Protein 7.2 6.5 - 8.1 g/dL   Albumin 3.4 (L) 3.5 - 5.0 g/dL   AST 55 (H) 15 - 41 U/L   ALT 62 (H) 14 - 54 U/L   Alkaline Phosphatase 58 38 - 126 U/L   Total Bilirubin 0.9 0.3 - 1.2 mg/dL   GFR calc non Af Amer >60 >60 mL/min   GFR calc Af Amer >60 >60 mL/min   Anion gap 11 5 -  15    Physical Exam  Vitals reviewed. Constitutional:  She is oriented to person, place, and time. She appears well-developed and well-nourished. No distress.  HENT:  Head: Normocephalic and atraumatic.  Eyes: Conjunctivae are normal.  Neck: Normal range of motion.  Cardiovascular: Normal rate.   Respiratory: Effort normal.  GI: Soft.  Musculoskeletal: Normal range of motion.  Neurological: She is alert and oriented to person, place, and time.  Skin: Skin is warm and dry.  Psychiatric: She has a normal mood and affect. Her behavior is normal.     FHR:140 bpm, Mod Var, -Decels, +Accels UC: None ED Course  Assessment: IUP at 25.3wks Cat I FT HyperEmesis PNC NonCompliance  Plan: -Discussed need to be seen in office and reestablish care-patient verbalizes understanding.  Expresses concern that pregnancy medicaid is inadequate -Labs: UA, CBC, CMP -LR with Multivitamin -Zofran    Follow Up (2201) -Nurse reports patient with some nausea -IV Reglan -PO Challenge -Reassess in 1 hour  Follow Up (2301) -Patient reports improvement of symptoms -States did not want crackers, but did drink sprite without issues -Discussed PNC compliance and need to follow up in office *NOB WU *Anatomy US *Discuss and attempt to resolve Insurance Issues -Mother updated on patient status and POC-requests to be primary contact to maintain patient compliance: Authorization given by patient on [facility] HIPPA Form -RX for reglan  Q6hrs prn -Message sent, via triage, for appts -Encouraged to call if any questions or concerns arise prior to next scheduled office visit.  -Discharged to home in improved condition  Cherre Robins CNM, MSN 03/29/2016 8:27 PM

## 2016-03-31 LAB — CULTURE, OB URINE: Culture: 40000 — AB

## 2016-04-07 ENCOUNTER — Encounter (HOSPITAL_COMMUNITY): Payer: Self-pay | Admitting: *Deleted

## 2016-04-07 ENCOUNTER — Inpatient Hospital Stay (HOSPITAL_COMMUNITY)
Admission: AD | Admit: 2016-04-07 | Discharge: 2016-04-07 | Disposition: A | Discharge status: Home / Self Care | Payer: BC Managed Care – PPO | Source: Ambulatory Visit | Attending: Obstetrics and Gynecology | Admitting: Obstetrics and Gynecology

## 2016-04-07 DIAGNOSIS — Z881 Allergy status to other antibiotic agents status: Secondary | ICD-10-CM | POA: Insufficient documentation

## 2016-04-07 DIAGNOSIS — O0932 Supervision of pregnancy with insufficient antenatal care, second trimester: Secondary | ICD-10-CM

## 2016-04-07 DIAGNOSIS — O2342 Unspecified infection of urinary tract in pregnancy, second trimester: Secondary | ICD-10-CM | POA: Diagnosis not present

## 2016-04-07 DIAGNOSIS — Z3A26 26 weeks gestation of pregnancy: Secondary | ICD-10-CM | POA: Insufficient documentation

## 2016-04-07 DIAGNOSIS — O210 Mild hyperemesis gravidarum: Secondary | ICD-10-CM | POA: Insufficient documentation

## 2016-04-07 DIAGNOSIS — Z87891 Personal history of nicotine dependence: Secondary | ICD-10-CM | POA: Diagnosis not present

## 2016-04-07 LAB — URINALYSIS, ROUTINE W REFLEX MICROSCOPIC
GLUCOSE, UA: NEGATIVE mg/dL
HGB URINE DIPSTICK: NEGATIVE
Ketones, ur: 15 mg/dL — AB
Nitrite: NEGATIVE
PROTEIN: 100 mg/dL — AB
SPECIFIC GRAVITY, URINE: 1.02 (ref 1.005–1.030)
pH: 8 (ref 5.0–8.0)

## 2016-04-07 LAB — COMPREHENSIVE METABOLIC PANEL
ALBUMIN: 2.9 g/dL — AB (ref 3.5–5.0)
ALK PHOS: 58 U/L (ref 38–126)
ALT: 17 U/L (ref 14–54)
AST: 35 U/L (ref 15–41)
Anion gap: 9 (ref 5–15)
BILIRUBIN TOTAL: 1.1 mg/dL (ref 0.3–1.2)
BUN: 5 mg/dL — ABNORMAL LOW (ref 6–20)
CALCIUM: 8.7 mg/dL — AB (ref 8.9–10.3)
CO2: 21 mmol/L — AB (ref 22–32)
CREATININE: 0.48 mg/dL (ref 0.44–1.00)
Chloride: 103 mmol/L (ref 101–111)
GFR calc Af Amer: >60 mL/min (ref >60)
GFR calc non Af Amer: >60 mL/min (ref >60)
GLUCOSE: 88 mg/dL (ref 65–99)
Potassium: 4.1 mmol/L (ref 3.5–5.1)
SODIUM: 133 mmol/L — AB (ref 135–145)
TOTAL PROTEIN: 6 g/dL — AB (ref 6.5–8.1)

## 2016-04-07 LAB — URINE MICROSCOPIC-ADD ON: RBC / HPF: NONE SEEN RBC/hpf (ref 0–5)

## 2016-04-07 LAB — WET PREP, GENITAL
CLUE CELLS WET PREP: NONE SEEN
Sperm: NONE SEEN
TRICH WET PREP: NONE SEEN
Yeast Wet Prep HPF POC: NONE SEEN

## 2016-04-07 MED ORDER — METOCLOPRAMIDE HCL 5 MG/ML IJ SOLN
10.0000 mg | Freq: Once | INTRAMUSCULAR | Status: AC
  Administered 2016-04-07: 10 mg via INTRAVENOUS
  Filled 2016-04-07: qty 2

## 2016-04-07 MED ORDER — PROMETHAZINE HCL 25 MG PO TABS: 25.0000 mg | ORAL_TABLET | Freq: Every evening | ORAL | Status: DC | PRN

## 2016-04-07 MED ORDER — M.V.I. ADULT IV INJ
Freq: Once | INTRAVENOUS | Status: AC
  Administered 2016-04-07: 17:00:00 via INTRAVENOUS
  Filled 2016-04-07: qty 1000

## 2016-04-07 MED ORDER — FAMOTIDINE IN NACL 20-0.9 MG/50ML-% IV SOLN
20.0000 mg | Freq: Once | INTRAVENOUS | Status: AC
  Administered 2016-04-07: 20 mg via INTRAVENOUS
  Filled 2016-04-07: qty 50

## 2016-04-07 MED ORDER — METOCLOPRAMIDE HCL 10 MG PO TABS: 10.0000 mg | ORAL_TABLET | Freq: Three times a day (TID) | ORAL | Status: DC

## 2016-04-07 MED ORDER — RANITIDINE HCL 150 MG PO CAPS: 150.0000 mg | ORAL_CAPSULE | Freq: Two times a day (BID) | ORAL | Status: DC

## 2016-04-07 MED ORDER — DEXTROSE 5 % IV SOLN
1.0000 g | Freq: Once | INTRAVENOUS | Status: AC
  Administered 2016-04-07: 1 g via INTRAVENOUS
  Filled 2016-04-07: qty 10

## 2016-04-07 MED ORDER — SODIUM CHLORIDE 0.9 % IV SOLN
8.0000 mg | Freq: Once | INTRAVENOUS | Status: AC
  Administered 2016-04-07: 8 mg via INTRAVENOUS
  Filled 2016-04-07: qty 4

## 2016-04-07 MED ORDER — ONDANSETRON 8 MG PO TBDP: 8.0000 mg | ORAL_TABLET | Freq: Three times a day (TID) | ORAL | Status: DC | PRN

## 2016-04-07 MED ORDER — PROMETHAZINE HCL 25 MG/ML IJ SOLN
25.0000 mg | Freq: Once | INTRAVENOUS | Status: AC
  Administered 2016-04-07: 25 mg via INTRAVENOUS
  Filled 2016-04-07: qty 1

## 2016-04-07 NOTE — MAU Provider Note (Signed)
History     CSN: [id number]  Arrival date and time: 04/07/16 1420   First Provider Initiated Contact with Patient 04/07/16 1530      Chief Complaint  Patient presents with  . Emesis During Pregnancy   HPI   Ms.Ashley Harvey is a 20 y.o. female G1P0 @ 23w5dpresenting to MAU with nausea and vomiting. She has been diagnosed with hyperemesis with this pregnancy; she has not started prenatal care due to her medicaid card having the wrong PCP indicated on it. She was planning to see [facility] however has been unable to schedule a visit due to her insurance.   In the last 24 hours she has vomited at least 30 times.  She took Zofran 8 mg ODT this morning when she woke up which did not help.  + fetal movements Denies vaginal bleeding or leaking of fluid.   OB History    Gravida Para Term Preterm AB TAB SAB Ectopic Multiple Living   1               Past Medical History  Diagnosis Date  . Seizures ([facility]     Past Surgical History  Procedure Laterality Date  . No past surgeries      Family History  Problem Relation Age of Onset  . Cancer Mother   . Hyperlipidemia Father   . Hypertension Father   . Diabetes Father   . Cancer Maternal Aunt   . Cancer Maternal Uncle   . Cancer Maternal Grandmother   . Cancer Maternal Grandfather   . Diabetes Paternal Grandmother   . Hypertension Paternal Grandmother   . Hypertension Paternal Grandfather     Social History  Substance Use Topics  . Smoking status: Former Smoker    Quit date: 10/30/2015  . Smokeless tobacco: Never Used  . Alcohol Use: No    Allergies:  Allergies  Allergen Reactions  . Vancomycin Anaphylaxis    Prescriptions prior to admission  Medication Sig Dispense Refill Last Dose  . ondansetron (ZOFRAN-ODT) 8 MG disintegrating tablet Take 8 mg by mouth every 8 (eight) hours as needed for nausea or vomiting.   04/07/2016 at Unknown time  . Prenatal MV-Min-FA-Omega-3 (PRENATAL GUMMIES/DHA & FA PO) Take 2 each by  mouth daily.   Past Week at Unknown time  . glycopyrrolate (ROBINUL) 1 MG tablet Take 2 tablets (2 mg total) by mouth 3 (three) times daily. (Patient not taking: Reported on 03/29/2016) 30 tablet 1 Past Week at Unknown time  . metoCLOPramide (REGLAN) 10 MG tablet Take 1 tablet (10 mg total) by mouth every 6 (six) hours as needed for nausea. (Patient not taking: Reported on 04/07/2016) 60 tablet 1 Not Taking at Unknown time  . promethazine (PHENERGAN) 25 MG tablet Take 1 tablet (25 mg total) by mouth every 6 (six) hours as needed for nausea or vomiting. (Patient not taking: Reported on 04/07/2016) 30 tablet 0 Not Taking at Unknown time   Results for orders placed or performed during the hospital encounter of 04/07/16 (from the past 48 hour(s))  [facility]inal[facility], [facility] reflex [facility]     Status: Abnormal   Collection Time: 04/07/16  2:45 PM  Result Value Ref Range   Color, Urine ORANGE (A) YELLOW    Comment: BIOCHEMICALS MAY BE AFFECTED BY COLOR   APPearance CLOUDY (A) CLEAR   Specific Gravity, Urine 1.020 1.005 - 1.030   pH 8.0 5.0 - 8.0   Glucose, UA NEGATIVE NEGATIVE mg/dL   Hgb  urine dipstick NEGATIVE NEGATIVE   Bilirubin Urine MODERATE (A) NEGATIVE   Ketones, ur 15 (A) NEGATIVE mg/dL   Protein, ur 100 (A) NEGATIVE mg/dL   Nitrite NEGATIVE NEGATIVE   Leukocytes, UA MODERATE (A) NEGATIVE  Urine microscopic-add on     Status: Abnormal   Collection Time: 04/07/16  2:45 PM  Result Value Ref Range   Squamous Epithelial / LPF 6-30 (A) NONE SEEN   WBC, UA 6-30 0 - 5 WBC/hpf   RBC / HPF NONE SEEN 0 - 5 RBC/hpf   Bacteria, UA FEW (A) NONE SEEN   Urine-Other MUCOUS PRESENT     Comment: AMORPHOUS URATES/PHOSPHATES  Comprehensive metabolic panel     Status: Abnormal   Collection Time: 04/07/16  4:00 PM  Result Value Ref Range   Sodium 133 (L) 135 - 145 mmol/L   Potassium 4.1 3.5 - 5.1 mmol/L   Chloride 103 101 - 111 mmol/L   CO2 21 (L) 22 - 32 mmol/L   Glucose, Bld 88 65 - 99  mg/dL   BUN 5 (L) 6 - 20 mg/dL   Creatinine, Ser 0.48 0.44 - 1.00 mg/dL   Calcium 8.7 (L) 8.9 - 10.3 mg/dL   Total Protein 6.0 (L) 6.5 - 8.1 g/dL   Albumin 2.9 (L) 3.5 - 5.0 g/dL   AST 35 15 - 41 U/L   ALT 17 14 - 54 U/L   Alkaline Phosphatase 58 38 - 126 U/L   Total Bilirubin 1.1 0.3 - 1.2 mg/dL   GFR calc non Af Amer >60 >60 mL/min   GFR calc Af Amer >60 >60 mL/min    Comment: (NOTE) The eGFR has been calculated using the CKD EPI equation. This calculation has not been validated in all clinical situations. eGFR's persistently <60 mL/min signify possible Chronic Kidney Disease.    Anion gap 9 5 - 15  Wet prep, genital     Status: Abnormal   Collection Time: 04/07/16  4:35 PM  Result Value Ref Range   Yeast Wet Prep HPF POC NONE SEEN NONE SEEN   Trich, Wet Prep NONE SEEN NONE SEEN   Clue Cells Wet Prep HPF POC NONE SEEN NONE SEEN   WBC, Wet Prep HPF POC MODERATE (A) NONE SEEN    Comment: MODERATE BACTERIA SEEN   Sperm NONE SEEN     Review of Systems  Constitutional: Negative for fever and chills.  HENT: Positive for congestion.   Respiratory: Positive for shortness of breath (Occasional ).   Cardiovascular: Negative for leg swelling.  Gastrointestinal: Positive for heartburn, nausea and vomiting. Negative for abdominal pain.   Physical Exam   Blood pressure 109/69, pulse 99, temperature 97.8 F (36.6 C), temperature source Oral, resp. rate 20, weight 142 lb 3.2 oz (64.501 kg), SpO2 96 %.  Physical Exam  Constitutional: She is oriented to person, place, and time. She appears well-developed and well-nourished. No distress.  HENT:  Head: Normocephalic.  Eyes: Pupils are equal, round, and reactive to light.  Cardiovascular: Normal rate and normal heart sounds.   Respiratory: Effort normal and breath sounds normal. No respiratory distress. She has no wheezes.  Genitourinary:  Wet prep collected by RN without speculum   Musculoskeletal: Normal range of motion. She  exhibits no edema.  Neurological: She is alert and oriented to person, place, and time.  Skin: Skin is warm. She is not diaphoretic.  Psychiatric: Her behavior is normal.    Fetal Tracing: Baseline: 140 bpm  Variability: Moderate  Accelerations:  10x10 Decelerations: Quick variables.  Toco: Quiet   MAU Course  Procedures  None  MDM  96% on RA  D5LR with phenergan MVI X 1 Zofran 8 mg IV Pepcid 20 mg IV  Rocephin 1 gram IV Patient failed PO antibiotics for UTI> Rocephin 1 gram given IV Wet prep  PO challenge: pass   1825: prior to discharge patient vomited.  Reglan 10 mg IV Additional Pepcid 20 mg IV  Patient tolerated PO fluids. DC home   Assessment and Plan   A:  1. Hyperemesis affecting pregnancy, antepartum   2. UTI in pregnancy, antepartum, second trimester   3. No prenatal care in current pregnancy in second trimester     D:  Discharge home in stable condition -Continue Zofran PRN -Rx Reglan 10 TID before meals -Rx Phenergan 25 mg at bedtime only -Rx Zantac 150 mg BID  Take medication as prescribed Small, frequent meals Urine culture pending- Rocephin given today in MAU Appointment made for June 5th @ 0845 for initial prenatal visit with [facility] OB complete ordered placed.  Fetal kick counts Return to MAU if symptoms worsen   Lezlie Lye, NP 04/07/2016 6:19 PM     Follow-up Information    Follow up with [facility] On 05/03/2016.   Specialty:  Obstetrics and Gynecology   Why:  As scheduled @ 0845    Contact information:   [address] [place] [phone]

## 2016-04-07 NOTE — Discharge Instructions (Signed)
Hyperemesis Gravidarum  Hyperemesis gravidarum is a severe form of nausea and vomiting that happens during pregnancy. Hyperemesis is worse than morning sickness. It may cause you to have nausea or vomiting all day for many days. It may keep you from eating and drinking enough food and liquids. Hyperemesis usually occurs during the first half (the first 20 weeks) of pregnancy. It often goes away once a woman is in her second half of pregnancy. However, sometimes hyperemesis continues through an entire pregnancy.   CAUSES   The cause of this condition is not completely known but is thought to be related to changes in the body's hormones when pregnant. It could be from the high level of the pregnancy hormone or an increase in estrogen in the body.   SIGNS AND SYMPTOMS    Severe nausea and vomiting.   Nausea that does not go away.   Vomiting that does not allow you to keep any food down.   Weight loss and body fluid loss (dehydration).   Having no desire to eat or not liking food you have previously enjoyed.  DIAGNOSIS   Your health care provider will do a physical exam and ask you about your symptoms. He or she may also order blood tests and urine tests to make sure something else is not causing the problem.   TREATMENT   You may only need medicine to control the problem. If medicines do not control the nausea and vomiting, you will be treated in the hospital to prevent dehydration, increased acid in the blood (acidosis), weight loss, and changes in the electrolytes in your body that may harm the unborn baby (fetus). You may need IV fluids.   HOME CARE INSTRUCTIONS    Only take over-the-counter or prescription medicines as directed by your health care provider.   Try eating a couple of dry crackers or toast in the morning before getting out of bed.   Avoid foods and smells that upset your stomach.   Avoid fatty and spicy foods.   Eat 5-6 small meals a day.   Do not drink when eating meals. Drink between  meals.   For snacks, eat high-protein foods, such as cheese.   Eat or suck on things that have ginger in them. Ginger helps nausea.   Avoid food preparation. The smell of food can spoil your appetite.   Avoid iron pills and iron in your multivitamins until after 3-4 months of being pregnant. However, consult with your health care provider before stopping any prescribed iron pills.  SEEK MEDICAL CARE IF:    Your abdominal pain increases.   You have a severe headache.   You have vision problems.   You are losing weight.  SEEK IMMEDIATE MEDICAL CARE IF:    You are unable to keep fluids down.   You vomit blood.   You have constant nausea and vomiting.   You have excessive weakness.   You have extreme thirst.   You have dizziness or fainting.   You have a fever or persistent symptoms for more than 2-3 days.   You have a fever and your symptoms suddenly get worse.  MAKE SURE YOU:    Understand these instructions.   Will watch your condition.   Will get help right away if you are not doing well or get worse.     This information is not intended to replace advice given to you by your health care provider. Make sure you discuss any questions you have with   your health care provider.     Document Released: 11/15/2005 Document Revised: 09/05/2013 Document Reviewed: 06/27/2013  Elsevier Interactive Patient Education 2016 [employer]

## 2016-04-07 NOTE — MAU Note (Signed)
Patient has not been able to keep anything down for past 48hrs.  Has had hyperemesis throughout pregnancy.  Has not had prenatal care because she is waiting for medicaid.

## 2016-04-09 LAB — CULTURE, OB URINE: SPECIAL REQUESTS: NORMAL

## 2016-05-01 ENCOUNTER — Encounter (HOSPITAL_COMMUNITY): Payer: Self-pay | Admitting: *Deleted

## 2016-05-01 ENCOUNTER — Inpatient Hospital Stay (HOSPITAL_COMMUNITY)
Admission: AD | Admit: 2016-05-01 | Discharge: 2016-05-02 | Disposition: A | Discharge status: Home / Self Care | Payer: BC Managed Care – PPO | Source: Ambulatory Visit | Attending: Family Medicine | Admitting: Family Medicine

## 2016-05-01 DIAGNOSIS — O210 Mild hyperemesis gravidarum: Secondary | ICD-10-CM | POA: Insufficient documentation

## 2016-05-01 DIAGNOSIS — Z87891 Personal history of nicotine dependence: Secondary | ICD-10-CM | POA: Diagnosis not present

## 2016-05-01 DIAGNOSIS — Z3A30 30 weeks gestation of pregnancy: Secondary | ICD-10-CM | POA: Diagnosis not present

## 2016-05-01 DIAGNOSIS — O219 Vomiting of pregnancy, unspecified: Secondary | ICD-10-CM | POA: Diagnosis not present

## 2016-05-01 DIAGNOSIS — R12 Heartburn: Secondary | ICD-10-CM | POA: Diagnosis not present

## 2016-05-01 LAB — URINE MICROSCOPIC-ADD ON: RBC / HPF: NONE SEEN RBC/hpf (ref 0–5)

## 2016-05-01 LAB — COMPREHENSIVE METABOLIC PANEL
ALBUMIN: 2.8 g/dL — AB (ref 3.5–5.0)
ALK PHOS: 86 U/L (ref 38–126)
ALT: 83 U/L — AB (ref 14–54)
AST: 67 U/L — AB (ref 15–41)
Anion gap: 7 (ref 5–15)
BUN: 6 mg/dL (ref 6–20)
CHLORIDE: 101 mmol/L (ref 101–111)
CO2: 24 mmol/L (ref 22–32)
Calcium: 8.4 mg/dL — ABNORMAL LOW (ref 8.9–10.3)
Creatinine, Ser: 0.41 mg/dL — ABNORMAL LOW (ref 0.44–1.00)
GFR calc Af Amer: >60 mL/min (ref >60)
GFR calc non Af Amer: >60 mL/min (ref >60)
GLUCOSE: 88 mg/dL (ref 65–99)
Potassium: 3 mmol/L — ABNORMAL LOW (ref 3.5–5.1)
SODIUM: 132 mmol/L — AB (ref 135–145)
TOTAL PROTEIN: 6 g/dL — AB (ref 6.5–8.1)
Total Bilirubin: 0.8 mg/dL (ref 0.3–1.2)

## 2016-05-01 LAB — URINALYSIS, ROUTINE W REFLEX MICROSCOPIC
GLUCOSE, UA: NEGATIVE mg/dL
NITRITE: NEGATIVE
PH: 6.5 (ref 5.0–8.0)
Protein, ur: 30 mg/dL — AB
Specific Gravity, Urine: 1.02 (ref 1.005–1.030)

## 2016-05-01 MED ORDER — GI COCKTAIL ~~LOC~~
30.0000 mL | Freq: Once | ORAL | Status: AC
  Administered 2016-05-01: 30 mL via ORAL
  Filled 2016-05-01: qty 30

## 2016-05-01 MED ORDER — LACTATED RINGERS IV BOLUS (SEPSIS)
2000.0000 mL | Freq: Once | INTRAVENOUS | Status: AC
  Administered 2016-05-01: 1000 mL via INTRAVENOUS

## 2016-05-01 MED ORDER — LACTATED RINGERS IV BOLUS (SEPSIS)
1000.0000 mL | Freq: Once | INTRAVENOUS | Status: AC
  Administered 2016-05-01: 1000 mL via INTRAVENOUS

## 2016-05-01 MED ORDER — PROMETHAZINE HCL 25 MG/ML IJ SOLN
25.0000 mg | Freq: Once | INTRAMUSCULAR | Status: AC
  Administered 2016-05-01: 25 mg via INTRAVENOUS
  Filled 2016-05-01: qty 1

## 2016-05-01 MED ORDER — LACTATED RINGERS IV SOLN: INTRAVENOUS | Status: DC

## 2016-05-01 NOTE — MAU Note (Signed)
Pt has vomited 2-3 times since in MAU but not a lot of emesis. A lot of spitting

## 2016-05-01 NOTE — Progress Notes (Signed)
Pt had turned to side and not tracing FHR. Transducer adj. Baby active and has hiccoughs

## 2016-05-01 NOTE — Progress Notes (Signed)
[facility] Jonette RN gave Dr Alvester MorinNewton info regarding pt's admission. Will give IVFs and Dr Alvester MorinNEwton will see pt

## 2016-05-01 NOTE — MAU Note (Signed)
Hyperemesis entire pregnancy. Unable to keep down anything for past 4 days. Sometimes sees blood in emesis. Denies vag bleeding or d/c. No diarrhea

## 2016-05-01 NOTE — MAU Provider Note (Signed)
MAU HISTORY AND PHYSICAL  Chief Complaint:  Hyperemesis Gravidarum  Ashley Harvey is a 20 y.o.  G1P0 with IUP at [redacted]w[redacted]d presenting for Hyperemesis Gravidarum  Vomiting for the last four days that has gotten worse. Emesis about 50 times today. Looked yellowish. There was blood streak about two days ago but not any more. She went to [facility] about two days ago for the same issue and she was given fluid and promethazine and discharged home. She says the phenergan helped a lot but she still continues to throw up 50 times a day. She tried Zofran and rabinol before, which didn't help.  She also reports belly pain that she describes as "the whole stomach burning". She denies radiation to her back or shoulder. She denies taking any antacid recently. She was on Zantac at one point. Denies fever, diarrhea, URI symptoms, headache, vision change, swelling in legs Reports some dysuria for about two days.  Denies contraction, vaginal bleeding, gush or leak of fluid or back pain. Reports that the baby is moving as usual.  She gets her [facility] here. She has an appointment with Dr. Francesco Runner on 05/03/2016. No concern about this pregnancy from her [facility] per patient.  History of seizure in her PMH but not on medication during pregnancy Reports Allergy to "one MRSA drug". She picked clindamycin when I listed the names. She says she couldn't breath when she took that medication.  Denies smoking, EtoH or recreational drugs.   Past Medical History  Diagnosis Date  . Seizures [facility])     Past Surgical History  Procedure Laterality Date  . No past surgeries      Family History  Problem Relation Age of Onset  . Cancer Mother   . Hyperlipidemia Father   . Hypertension Father   . Diabetes Father   . Cancer Maternal Aunt   . Cancer Maternal Uncle   . Cancer Maternal Grandmother   . Cancer Maternal Grandfather   . Diabetes Paternal Grandmother   . Hypertension Paternal Grandmother   . Hypertension Paternal  Grandfather     Social History  Substance Use Topics  . Smoking status: Former Smoker    Quit date: 10/30/2015  . Smokeless tobacco: Never Used  . Alcohol Use: No    Allergies  Allergen Reactions  . Vancomycin Anaphylaxis    Prescriptions prior to admission  Medication Sig Dispense Refill Last Dose  . metoCLOPramide (REGLAN) 10 MG tablet Take 1 tablet (10 mg total) by mouth 3 (three) times daily before meals. 90 tablet 2 05/01/2016 at Unknown time  . ondansetron (ZOFRAN-ODT) 8 MG disintegrating tablet Take 1 tablet (8 mg total) by mouth every 8 (eight) hours as needed for nausea or vomiting. 20 tablet 3 05/01/2016 at Unknown time  . Prenatal MV-Min-FA-Omega-3 (PRENATAL GUMMIES/DHA & FA PO) Take 2 each by mouth daily.   Past Week at Unknown time  . promethazine (PHENERGAN) 25 MG tablet Take 25 mg by mouth every 6 (six) hours as needed for nausea or vomiting.   05/01/2016 at Unknown time  . ranitidine (ZANTAC) 150 MG capsule Take 1 capsule (150 mg total) by mouth 2 (two) times daily. (Patient not taking: Reported on 05/01/2016) 60 capsule 2     Review of Systems - Negative except for what is mentioned in HPI.  Physical Exam  Blood pressure 125/83, pulse 92, temperature 98.2 F (36.8 C), resp. rate 18, height  (1.575 m), weight 142 lb 9.6 oz (64.683 kg). GENERAL: Well-developed, well-nourished female in  no acute distress.  Oropharynx: lips looked chapped LUNGS: Clear to auscultation bilaterally.  HEART: Regular rate and rhythm. ABDOMEN: Soft, nontender, nondistended, gravid. GU; no suprapubic or CVA tenderness. EXTREMITIES: Nontender, no edema, 2+ distal pulses. Cervical Exam: not done Presentation: not assessed FHT: reactive  Contractions: none   Labs: Results for orders placed or performed during the hospital encounter of 05/01/16 (from the past 24 hour(s))  Urinalysis, Routine w reflex microscopic (not at [facility])   Collection Time: 05/01/16  7:35 PM  Result Value Ref Range    Color, Urine AMBER (A) YELLOW   APPearance HAZY (A) CLEAR   Specific Gravity, Urine 1.020 1.005 - 1.030   pH 6.5 5.0 - 8.0   Glucose, UA NEGATIVE NEGATIVE mg/dL   Hgb urine dipstick TRACE (A) NEGATIVE   Bilirubin Urine MODERATE (A) NEGATIVE   Ketones, ur >80 (A) NEGATIVE mg/dL   Protein, ur 30 (A) NEGATIVE mg/dL   Nitrite NEGATIVE NEGATIVE   Leukocytes, UA MODERATE (A) NEGATIVE  Urine microscopic-add on   Collection Time: 05/01/16  7:35 PM  Result Value Ref Range   Squamous Epithelial / LPF 6-30 (A) NONE SEEN   WBC, UA 6-30 0 - 5 WBC/hpf   RBC / HPF NONE SEEN 0 - 5 RBC/hpf   Bacteria, UA MANY (A) NONE SEEN   Urine-Other MUCOUS PRESENT     Imaging Studies:  No results found.  Assessment: Ashley Harvey is  20 y.o. G1P0 at 4710w1d presents with Hyperemesis Gravidarum. Unlikely cholestasis or pancreatitis. Doubt infectious process in the absence of fever. She has mild dysuria which could also be dehydration. She has ketonuria to >80 and cap refills over 3 sec even after 1L of LR. UA with many bacteria and few LE. It is also with 6-30 Squamous cells which suggests contamination in the absence of suprapubic or CVA tenderness. Gave her another 1L of LR, promethazine 25 mg IV and GI cocktail. Patient symptoms improved with this. She tolerated by mouth and discharged home. Patient had mild hypokalemia and mildly elevated LFTs on CMP likely from emesis. BP within normal range. I anticipate this to resolve when emesis in under control.  Plan: Discharge home. She has follow up on 05/03/2016 Gave Rx for Zantac for heartburn Patient has Rx for promethazine at home Discussed return precautions  Almon Herculesaye T Gonfa 6/3/20179:23 PM   Future Appointments Date Time Provider Department Center  05/03/2016 8:45 AM Reva Boresanya S Pratt, MD [facility]    OB fellow attestation:  I have seen and examined this patient; I agree with above documentation in the resident's note.   Ashley Harvey is a 20 y.o. G1P0  reporting Nausea and vomiting in pregnancy.  +FM, denies LOF, VB, contractions, vaginal discharge.  PE: BP 119/75 mmHg  Pulse 95  Temp(Src) 98.3 F (36.8 C)  Resp 18  Ht 5\' 2"  (1.575 m)  Wt 142 lb 9.6 oz (64.683 kg)  BMI 26.08 kg/m2  LMP  Gen: calm comfortable, NAD Resp: normal effort, no distress Abd: gravid  ROS, labs, PMH reviewed NST reactive   Plan: -  NV in pregnancy: s/p GI cocktail. Recommend zantac and phenegeran. - fetal kick counts reinforced, preterm labor precaution  Federico FlakeKimberly Niles Hyacinth Marcelli, MD , MPH, ABFM Family Medicine, OB Fellow [facility]

## 2016-05-01 NOTE — Progress Notes (Signed)
efm d/ced. FHR reactive 

## 2016-05-02 DIAGNOSIS — O219 Vomiting of pregnancy, unspecified: Secondary | ICD-10-CM

## 2016-05-02 DIAGNOSIS — O210 Mild hyperemesis gravidarum: Secondary | ICD-10-CM | POA: Diagnosis not present

## 2016-05-02 MED ORDER — RANITIDINE HCL 150 MG PO CAPS: 150.0000 mg | ORAL_CAPSULE | Freq: Two times a day (BID) | ORAL | Status: DC

## 2016-05-02 NOTE — Progress Notes (Signed)
Written and verbal d/c instructions given and understanding voiced. 

## 2016-05-02 NOTE — Progress Notes (Signed)
Dr Alanda SlimGonfa on unit and aware of pt's status. Pt stable for d/c home

## 2016-05-02 NOTE — Discharge Instructions (Signed)
It is nice taking care of you! You were seen for excessive vomiting and stomach pain. Both have improved with fluid and medication. We are discharging you on medication for nausea and stomach pain. The medications are phenergan and Zantac respectively. Please go to all your appointments. Below is some reading about vomiting and stomach pain in pregnancy.   Hyperemesis Gravidarum Hyperemesis gravidarum is a severe form of nausea and vomiting that happens during pregnancy. Hyperemesis is worse than morning sickness. It may cause you to have nausea or vomiting all day for many days. It may keep you from eating and drinking enough food and liquids. Hyperemesis usually occurs during the first half (the first 20 weeks) of pregnancy. It often goes away once a woman is in her second half of pregnancy. However, sometimes hyperemesis continues through an entire pregnancy.  CAUSES  The cause of this condition is not completely known but is thought to be related to changes in the body's hormones when pregnant. It could be from the high level of the pregnancy hormone or an increase in estrogen in the body.  SIGNS AND SYMPTOMS   Severe nausea and vomiting.  Nausea that does not go away.  Vomiting that does not allow you to keep any food down.  Weight loss and body fluid loss (dehydration).  Having no desire to eat or not liking food you have previously enjoyed. DIAGNOSIS  Your health care provider will do a physical exam and ask you about your symptoms. He or she may also order blood tests and urine tests to make sure something else is not causing the problem.  TREATMENT  You may only need medicine to control the problem. If medicines do not control the nausea and vomiting, you will be treated in the hospital to prevent dehydration, increased acid in the blood (acidosis), weight loss, and changes in the electrolytes in your body that may harm the unborn baby (fetus). You may need IV fluids.  HOME CARE  INSTRUCTIONS   Only take over-the-counter or prescription medicines as directed by your health care provider.  Try eating a couple of dry crackers or toast in the morning before getting out of bed.  Avoid foods and smells that upset your stomach.  Avoid fatty and spicy foods.  Eat 5-6 small meals a day.  Do not drink when eating meals. Drink between meals.  For snacks, eat high-protein foods, such as cheese.  Eat or suck on things that have ginger in them. Ginger helps nausea.  Avoid food preparation. The smell of food can spoil your appetite.  Avoid iron pills and iron in your multivitamins until after 3-4 months of being pregnant. However, consult with your health care provider before stopping any prescribed iron pills. SEEK MEDICAL CARE IF:   Your abdominal pain increases.  You have a severe headache.  You have vision problems.  You are losing weight. SEEK IMMEDIATE MEDICAL CARE IF:   You are unable to keep fluids down.  You vomit blood.  You have constant nausea and vomiting.  You have excessive weakness.  You have extreme thirst.  You have dizziness or fainting.  You have a fever or persistent symptoms for more than 2-3 days.  You have a fever and your symptoms suddenly get worse. MAKE SURE YOU:   Understand these instructions.  Will watch your condition.  Will get help right away if you are not doing well or get worse.   This information is not intended to replace advice given  to you by your health care provider. Make sure you discuss any questions you have with your health care provider.   Document Released: 11/15/2005 Document Revised: 09/05/2013 Document Reviewed: 06/27/2013 Elsevier Interactive Patient Education 2016 Elsevier Inc.  Heartburn During Pregnancy  Heartburn happens when stomach acid goes up into the esophagus. The esophagus is the tube between the mouth and the stomach. This acid causes a burning pain in the chest or throat. This  happens more often in the later part of pregnancy because the womb (uterus) gets larger. It may also happen because of hormone changes. Heartburn problems often go away after giving birth. HOME CARE  Take all medicine as told by your doctor.  Raise the head of your bed with blocks only as told by your doctor.  Do not exercise right after eating.  Avoid eating 2-3 hours before bed. Do not lie down right after eating.  Eat small meals throughout the day instead of 3 large meals.  Avoid foods that give you heartburn. Foods you may want to avoid include:  Peppers.  Chocolate.  High-fat foods, including fried foods.  Spicy foods.  Garlic and onions.  Citrus fruits, including oranges, grapefruit, lemons, and limes.  Food containing tomatoes or tomato products.  Mint.  Bubbly (carbonated) drinks and drinks with caffeine.  Vinegar. GET HELP IF:  You have any belly (abdominal) pain.  You feel burning in your upper belly or chest, especially after eating or lying down.  You feel sick to your stomach (nauseous) and throw up (vomit).  Your stomach feels upset after you eat. GET HELP RIGHT AWAY IF:  You have bad chest pain that goes down your arm or into your jaw or neck.  You feel sweaty, dizzy, or light-headed.  You have trouble breathing.  You throw up blood.  You have trouble or pain when swallowing.  You have bloody or black poop (stool).  You have heartburn more than 3 times a week, for more than 2 weeks. MAKE SURE YOU:  Understand these instructions.  Will watch your condition.  Will get help right away if you are not doing well or get worse.   This information is not intended to replace advice given to you by your health care provider. Make sure you discuss any questions you have with your health care provider.   Document Released: 12/18/2010 Document Revised: 11/20/2013 Document Reviewed: 07/04/2013 Elsevier Interactive Patient Education [employer].

## 2016-05-03 ENCOUNTER — Encounter: Payer: BC Managed Care – PPO | Admitting: Family Medicine

## 2016-05-03 LAB — CULTURE, OB URINE
Culture: >=100000 — AB
SPECIAL REQUESTS: NORMAL

## 2016-05-31 ENCOUNTER — Encounter: Payer: Self-pay | Admitting: Obstetrics & Gynecology

## 2016-05-31 ENCOUNTER — Ambulatory Visit (INDEPENDENT_AMBULATORY_CARE_PROVIDER_SITE_OTHER): Payer: BC Managed Care – PPO | Admitting: Obstetrics & Gynecology

## 2016-05-31 VITALS — BP 112/63 | HR 74 | Temp 98.5°F | Wt 159.0 lb

## 2016-05-31 DIAGNOSIS — O0933 Supervision of pregnancy with insufficient antenatal care, third trimester: Secondary | ICD-10-CM | POA: Insufficient documentation

## 2016-05-31 DIAGNOSIS — Z3483 Encounter for supervision of other normal pregnancy, third trimester: Secondary | ICD-10-CM | POA: Diagnosis present

## 2016-05-31 DIAGNOSIS — Z3493 Encounter for supervision of normal pregnancy, unspecified, third trimester: Secondary | ICD-10-CM

## 2016-05-31 DIAGNOSIS — Z23 Encounter for immunization: Secondary | ICD-10-CM | POA: Diagnosis not present

## 2016-05-31 DIAGNOSIS — Z3400 Encounter for supervision of normal first pregnancy, unspecified trimester: Secondary | ICD-10-CM | POA: Insufficient documentation

## 2016-05-31 DIAGNOSIS — Z3403 Encounter for supervision of normal first pregnancy, third trimester: Secondary | ICD-10-CM

## 2016-05-31 LAB — CBC
HEMATOCRIT: 31.9 % — AB (ref 35.0–45.0)
HEMOGLOBIN: 10.6 g/dL — AB (ref 11.7–15.5)
MCH: 30.3 pg (ref 27.0–33.0)
MCHC: 33.2 g/dL (ref 32.0–36.0)
MCV: 91.1 fL (ref 80.0–100.0)
MPV: 10.5 fL (ref 7.5–12.5)
Platelets: 249 10*3/uL (ref 140–400)
RBC: 3.5 MIL/uL — ABNORMAL LOW (ref 3.80–5.10)
RDW: 14.1 % (ref 11.0–15.0)
WBC: 16.4 10*3/uL — AB (ref 3.8–10.8)

## 2016-05-31 LAB — POCT URINALYSIS DIP (DEVICE)
BILIRUBIN URINE: NEGATIVE
Glucose, UA: NEGATIVE mg/dL
HGB URINE DIPSTICK: NEGATIVE
Ketones, ur: NEGATIVE mg/dL
NITRITE: NEGATIVE
PH: 7 (ref 5.0–8.0)
Protein, ur: NEGATIVE mg/dL
SPECIFIC GRAVITY, URINE: 1.015 (ref 1.005–1.030)
Urobilinogen, UA: 0.2 mg/dL (ref 0.0–1.0)

## 2016-05-31 MED ORDER — PANTOPRAZOLE SODIUM 40 MG PO TBEC: 40.0000 mg | DELAYED_RELEASE_TABLET | Freq: Every day | ORAL | Status: DC

## 2016-05-31 MED ORDER — TETANUS-DIPHTH-ACELL PERTUSSIS 5-2.5-18.5 LF-MCG/0.5 IM SUSP
0.5000 mL | Freq: Once | INTRAMUSCULAR | Status: AC
  Administered 2016-05-31: 0.5 mL via INTRAMUSCULAR

## 2016-05-31 MED ORDER — METOCLOPRAMIDE HCL 10 MG PO TABS: 10.0000 mg | ORAL_TABLET | Freq: Three times a day (TID) | ORAL | Status: DC

## 2016-05-31 MED ORDER — PROMETHAZINE HCL 25 MG PO TABS: 25.0000 mg | ORAL_TABLET | Freq: Four times a day (QID) | ORAL | Status: DC | PRN

## 2016-05-31 NOTE — Progress Notes (Signed)
   Subjective:last prenatal visit was in [place] in [place]    Ashley Harvey is a G1P0 6229w3d being seen today for her first obstetrical visit.  Her obstetrical history is significant for insufficient prenatal care, seizures and nausea and vomiting. Patient does intend to breast feed. Pregnancy history fully reviewed.  Patient reports nausea, vomiting and occasional seizure.  Filed Vitals:   05/31/16 1247  BP: 112/63  Pulse: 74  Temp: 98.5 F (36.9 C)  Weight: 159 lb (72.122 kg)    HISTORY: OB History  Gravida Para Term Preterm AB SAB TAB Ectopic Multiple Living  1             # Outcome Date GA Lbr Len/2nd Weight Sex Delivery Anes PTL Lv  1 Current              Past Medical History  Diagnosis Date  . Seizures [facility](HCC)    Past Surgical History  Procedure Laterality Date  . No past surgeries     Family History  Problem Relation Age of Onset  . Cancer Mother   . Hyperlipidemia Father   . Hypertension Father   . Diabetes Father   . Cancer Maternal Aunt   . Cancer Maternal Uncle   . Cancer Maternal Grandmother   . Cancer Maternal Grandfather   . Diabetes Paternal Grandmother   . Hypertension Paternal Grandmother   . Hypertension Paternal Grandfather      Exam    Uterus:     Pelvic Exam:    Perineum: No Hemorrhoids   Vulva: normal   Vagina:  normal mucosa   pH:     Cervix: no lesions   Adnexa: not evaluated   Bony Pelvis: average  System: Breast:      Skin: normal coloration and turgor, no rashes    Neurologic: oriented, normal mood   Extremities: normal strength, tone, and muscle mass   HEENT extra ocular movement intact   Mouth/Teeth dental hygiene good   Neck supple   Cardiovascular: regular rate and rhythm   Respiratory:  appears well, vitals normal, no respiratory distress, acyanotic, normal RR   Abdomen: gravid c/w dates   Urinary: urethral meatus normal      Assessment:    Pregnancy: G1P0 Patient Active Problem List   Diagnosis Date  Noted  . Supervision of low-risk first pregnancy 05/31/2016  . Insufficient prenatal care in third trimester 05/31/2016  . Protein-calorie malnutrition, severe 12/09/2015  . Hyperemesis affecting pregnancy, antepartum 12/05/2015        Plan:     Initial labs drawn. Prenatal vitamins. Problem list reviewed and updated. Genetic Screening discussed Quad Screen: reported as normal.  Ultrasound discussed; fetal survey: ordered.  Follow up in 1 weeks. 50% of 30 min visit spent on counseling and coordination of care.  Protonix 40 mg and reglan 10 mg with meals   ARNOLD,JAMES 05/31/2016

## 2016-05-31 NOTE — Addendum Note (Signed)
Addended by: Aldona LentoFISHER, Laycie Schriner L on: 05/31/2016 05:29 PM   Modules accepted: Orders

## 2016-05-31 NOTE — Progress Notes (Signed)
Here for first prenatal visit. Given new patient education packets. Moderate leukocytes noted on urinalysis. Signed release for records from [facility] where she went early in pregnancy.

## 2016-06-01 LAB — RPR

## 2016-06-01 LAB — GLUCOSE TOLERANCE, 1 HOUR (50G) W/O FASTING: Glucose, 1 Hr, gestational: 72 mg/dL (ref <140)

## 2016-06-01 LAB — HIV ANTIBODY (ROUTINE TESTING W REFLEX): HIV 1&2 Ab, 4th Generation: NONREACTIVE

## 2016-06-02 LAB — PAIN MGMT, PROFILE 6 CONF W/O MM, U
6 Acetylmorphine: NEGATIVE ng/mL (ref <10)
AMPHETAMINES: NEGATIVE ng/mL (ref <500)
Alcohol Metabolites: NEGATIVE ng/mL (ref <500)
BARBITURATES: NEGATIVE ng/mL (ref <300)
BENZODIAZEPINES: NEGATIVE ng/mL (ref <100)
CREATININE: 56.6 mg/dL (ref >=20.0)
Cocaine Metabolite: NEGATIVE ng/mL (ref <150)
Marijuana Metabolite: NEGATIVE ng/mL (ref <20)
Methadone Metabolite: NEGATIVE ng/mL (ref <100)
OPIATES: NEGATIVE ng/mL (ref <100)
OXIDANT: NEGATIVE ug/mL (ref <200)
Oxycodone: NEGATIVE ng/mL (ref <100)
PH: 6.93 (ref 4.5–9.0)
Phencyclidine: NEGATIVE ng/mL (ref <25)
Please note:: 0

## 2016-06-07 ENCOUNTER — Ambulatory Visit (HOSPITAL_COMMUNITY)
Admission: RE | Admit: 2016-06-07 | Discharge: 2016-06-07 | Disposition: A | Discharge status: Home / Self Care | Payer: BC Managed Care – PPO | Source: Ambulatory Visit | Attending: Obstetrics & Gynecology | Admitting: Obstetrics & Gynecology

## 2016-06-07 DIAGNOSIS — Z3A34 34 weeks gestation of pregnancy: Secondary | ICD-10-CM | POA: Insufficient documentation

## 2016-06-07 DIAGNOSIS — Z3493 Encounter for supervision of normal pregnancy, unspecified, third trimester: Secondary | ICD-10-CM

## 2016-06-07 DIAGNOSIS — Z3483 Encounter for supervision of other normal pregnancy, third trimester: Secondary | ICD-10-CM | POA: Diagnosis present

## 2016-06-09 ENCOUNTER — Encounter: Payer: Self-pay | Admitting: *Deleted

## 2016-06-10 ENCOUNTER — Encounter (HOSPITAL_COMMUNITY): Payer: Self-pay

## 2016-06-10 ENCOUNTER — Inpatient Hospital Stay (EMERGENCY_DEPARTMENT_HOSPITAL)
Admission: AD | Admit: 2016-06-10 | Discharge: 2016-06-10 | Disposition: A | Discharge status: Home / Self Care | Payer: BC Managed Care – PPO | Source: Ambulatory Visit | Attending: Obstetrics & Gynecology | Admitting: Obstetrics & Gynecology

## 2016-06-10 ENCOUNTER — Inpatient Hospital Stay (HOSPITAL_COMMUNITY)
Admission: AD | Admit: 2016-06-10 | Discharge: 2016-06-11 | DRG: 778 | Disposition: A | Discharge status: Home / Self Care | Payer: BC Managed Care – PPO | Source: Ambulatory Visit | Attending: Obstetrics & Gynecology | Admitting: Obstetrics & Gynecology

## 2016-06-10 DIAGNOSIS — O0933 Supervision of pregnancy with insufficient antenatal care, third trimester: Secondary | ICD-10-CM

## 2016-06-10 DIAGNOSIS — Z8249 Family history of ischemic heart disease and other diseases of the circulatory system: Secondary | ICD-10-CM

## 2016-06-10 DIAGNOSIS — Z87891 Personal history of nicotine dependence: Secondary | ICD-10-CM

## 2016-06-10 DIAGNOSIS — Z3A35 35 weeks gestation of pregnancy: Secondary | ICD-10-CM

## 2016-06-10 DIAGNOSIS — O4700 False labor before 37 completed weeks of gestation, unspecified trimester: Secondary | ICD-10-CM

## 2016-06-10 DIAGNOSIS — Z833 Family history of diabetes mellitus: Secondary | ICD-10-CM

## 2016-06-10 DIAGNOSIS — O479 False labor, unspecified: Secondary | ICD-10-CM

## 2016-06-10 DIAGNOSIS — O2513 Malnutrition in pregnancy, third trimester: Secondary | ICD-10-CM | POA: Diagnosis present

## 2016-06-10 DIAGNOSIS — IMO0001 Reserved for inherently not codable concepts without codable children: Secondary | ICD-10-CM

## 2016-06-10 DIAGNOSIS — O4703 False labor before 37 completed weeks of gestation, third trimester: Secondary | ICD-10-CM

## 2016-06-10 LAB — URINALYSIS, ROUTINE W REFLEX MICROSCOPIC
BILIRUBIN URINE: NEGATIVE
Glucose, UA: NEGATIVE mg/dL
HGB URINE DIPSTICK: NEGATIVE
Ketones, ur: NEGATIVE mg/dL
Nitrite: NEGATIVE
PH: 6 (ref 5.0–8.0)
Protein, ur: NEGATIVE mg/dL
SPECIFIC GRAVITY, URINE: 1.005 (ref 1.005–1.030)

## 2016-06-10 LAB — URINE MICROSCOPIC-ADD ON
Bacteria, UA: NONE SEEN
RBC / HPF: NONE SEEN RBC/hpf (ref 0–5)
Squamous Epithelial / LPF: NONE SEEN

## 2016-06-10 MED ORDER — BETAMETHASONE SOD PHOS & ACET 6 (3-3) MG/ML IJ SUSP
12.5000 mg | Freq: Once | INTRAMUSCULAR | Status: AC
  Administered 2016-06-11: 12.5 mg via INTRAMUSCULAR
  Filled 2016-06-10: qty 2.1

## 2016-06-10 MED ORDER — LACTATED RINGERS IV SOLN
INTRAVENOUS | Status: DC
  Administered 2016-06-10: 125 mL/h via INTRAVENOUS
  Administered 2016-06-11: 09:00:00 via INTRAVENOUS

## 2016-06-10 NOTE — MAU Provider Note (Signed)
History     CSN: [id number]  Arrival date and time: 06/10/16 0120   First Provider Initiated Contact with Patient 06/10/16 0219      No chief complaint on file.  HPI Comments: Ashley Harvey is a 20 y.o. G1P0 at 5779w6d who presents today with contractions. She states that her contractions began around 2300. She rates her pain 3/10 at this time. She denies any VB or LOF. She confirms normal fetal movement. She states that she has not taken anything for the pain at home. Nothing makes the pain better. Nothing makes the pain worse.    Past Medical History  Diagnosis Date  . Seizures [facility](HCC)     Past Surgical History  Procedure Laterality Date  . No past surgeries      Family History  Problem Relation Age of Onset  . Cancer Mother   . Hyperlipidemia Father   . Hypertension Father   . Diabetes Father   . Cancer Maternal Aunt   . Cancer Maternal Uncle   . Cancer Maternal Grandmother   . Cancer Maternal Grandfather   . Diabetes Paternal Grandmother   . Hypertension Paternal Grandmother   . Hypertension Paternal Grandfather     Social History  Substance Use Topics  . Smoking status: Former Smoker    Quit date: 10/30/2015  . Smokeless tobacco: Never Used  . Alcohol Use: No    Allergies:  Allergies  Allergen Reactions  . Vancomycin Anaphylaxis    Prescriptions prior to admission  Medication Sig Dispense Refill Last Dose  . metoCLOPramide (REGLAN) 10 MG tablet Take 1 tablet (10 mg total) by mouth 3 (three) times daily before meals. 60 tablet 1   . ondansetron (ZOFRAN-ODT) 8 MG disintegrating tablet Take 1 tablet (8 mg total) by mouth every 8 (eight) hours as needed for nausea or vomiting. 20 tablet 3 Taking  . pantoprazole (PROTONIX) 40 MG tablet Take 1 tablet (40 mg total) by mouth daily. 30 tablet 1   . Prenatal MV-Min-FA-Omega-3 (PRENATAL GUMMIES/DHA & FA PO) Take 2 each by mouth daily.   Taking  . promethazine (PHENERGAN) 25 MG tablet Take 1 tablet (25 mg total) by  mouth every 6 (six) hours as needed for nausea or vomiting. 60 tablet 1     Review of Systems  Constitutional: Negative for fever and chills.  Gastrointestinal: Positive for abdominal pain. Negative for nausea, vomiting, diarrhea and constipation.  Genitourinary: Negative for dysuria, urgency and frequency.   Physical Exam   Blood pressure 118/72, pulse 106, temperature 98.3 F (36.8 C), temperature source Oral, resp. rate 16, height 5\' 2"  (1.575 m), weight 75.297 kg (166 lb), SpO2 97 %.  Physical Exam  Nursing note and vitals reviewed. Constitutional: She is oriented to person, place, and time. She appears well-developed and well-nourished. No distress.  HENT:  Head: Normocephalic.  Cardiovascular: Normal rate.   Respiratory: Effort normal.  GI: Soft. There is no tenderness.  Genitourinary:  2/thick, per RN   Neurological: She is alert and oriented to person, place, and time.  Skin: Skin is warm and dry.  Psychiatric: She has a normal mood and affect.   FHT: 130, moderate with 15x15 accels,no decels Toco: irregular UCs  MAU Course  Procedures  MDM   Assessment and Plan   1. Braxton Hicks contractions   2. [redacted] weeks gestation of pregnancy   3. Threatened premature labor, antepartum    DC home Comfort measures reviewed  3rd Trimester precautions  PTL precautions  Fetal kick  counts RX: none  Return to MAU as needed FU with OB as planned  Follow-up Information    Follow up with [facility].   Specialty:  Obstetrics and Gynecology   Why:  As scheduled   Contact information:   [address] [place] [address] [phone]        Tawnya Crook 06/10/2016, 2:29 AM

## 2016-06-10 NOTE — MAU Note (Signed)
Pt's mother states she checked her cervix at home before they came in and she was 2cm/20% effaced and that's why they came in to see if we can stop the contractions

## 2016-06-10 NOTE — Discharge Instructions (Signed)
Braxton Hicks Contractions °Contractions of the uterus can occur throughout pregnancy. Contractions are not always a sign that you are in labor.  °WHAT ARE BRAXTON HICKS CONTRACTIONS?  °Contractions that occur before labor are called Braxton Hicks contractions, or false labor. Toward the end of pregnancy (32-34 weeks), these contractions can develop more often and may become more forceful. This is not true labor because these contractions do not result in opening (dilatation) and thinning of the cervix. They are sometimes difficult to tell apart from true labor because these contractions can be forceful and people have different pain tolerances. You should not feel embarrassed if you go to the hospital with false labor. Sometimes, the only way to tell if you are in true labor is for your health care provider to look for changes in the cervix. °If there are no prenatal problems or other health problems associated with the pregnancy, it is completely safe to be sent home with false labor and await the onset of true labor. °HOW CAN YOU TELL THE DIFFERENCE BETWEEN TRUE AND FALSE LABOR? °False Labor °· The contractions of false labor are usually shorter and not as hard as those of true labor.   °· The contractions are usually irregular.   °· The contractions are often felt in the front of the lower abdomen and in the groin.   °· The contractions may go away when you walk around or change positions while lying down.   °· The contractions get weaker and are shorter lasting as time goes on.   °· The contractions do not usually become progressively stronger, regular, and closer together as with true labor.   °True Labor °· Contractions in true labor last 30-70 seconds, become very regular, usually become more intense, and increase in frequency.   °· The contractions do not go away with walking.   °· The discomfort is usually felt in the top of the uterus and spreads to the lower abdomen and low back.   °· True labor can be  determined by your health care provider with an exam. This will show that the cervix is dilating and getting thinner.   °WHAT TO REMEMBER °· Keep up with your usual exercises and follow other instructions given by your health care provider.   °· Take medicines as directed by your health care provider.   °· Keep your regular prenatal appointments.   °· Eat and drink lightly if you think you are going into labor.   °· If Braxton Hicks contractions are making you uncomfortable:   °¨ Change your position from lying down or resting to walking, or from walking to resting.   °¨ Sit and rest in a tub of warm water.   °¨ Drink 2-3 glasses of water. Dehydration may cause these contractions.   °¨ Do slow and deep breathing several times an hour.   °WHEN SHOULD I SEEK IMMEDIATE MEDICAL CARE? °Seek immediate medical care if: °· Your contractions become stronger, more regular, and closer together.   °· You have fluid leaking or gushing from your vagina.   °· You have a fever.   °· You pass blood-tinged mucus.   °· You have vaginal bleeding.   °· You have continuous abdominal pain.   °· You have low back pain that you never had before.   °· You feel your baby's head pushing down and causing pelvic pressure.   °· Your baby is not moving as much as it used to.   °  °This information is not intended to replace advice given to you by your health care provider. Make sure you discuss any questions you have with your health care   provider. °  °Document Released: 11/15/2005 Document Revised: 11/20/2013 Document Reviewed: 08/27/2013 °Elsevier Interactive Patient Education ©2016 Elsevier Inc. ° °

## 2016-06-10 NOTE — MAU Note (Signed)
Pt reports contractions for 2.5 hours, denies bleeding or ROM.

## 2016-06-10 NOTE — H&P (Signed)
Ashley SillLinsey Harvey is a 20 y.o. female G1P0 with IUP at 6866w6d presenting for contractions. Pt states she has been having irregular, every 2-4 minutes contractions, associated with none vaginal bleeding for 24 hours..  Membranes are intact, with active fetal movement.   [facility], many MAU visits  Prenatal History/Complications: Hyperemesis Insufficient PNC Pt had one seizure in her lifetime, not on meds Past Medical History: Past Medical History  Diagnosis Date  . Seizures (HCC)     Past Surgical History: Past Surgical History  Procedure Laterality Date  . No past surgeries      Obstetrical History: OB History    Gravida Para Term Preterm AB TAB SAB Ectopic Multiple Living   1                Social History: Social History   Social History  . Marital Status: Single    Spouse Name: N/A  . Number of Children: N/A  . Years of Education: N/A   Social History Main Topics  . Smoking status: Former Smoker    Quit date: 10/30/2015  . Smokeless tobacco: Never Used  . Alcohol Use: No  . Drug Use: 7.00 per week    Special: Marijuana  . Sexual Activity: Yes    Birth Control/ Protection: None   Other Topics Concern  . None   Social History Narrative    Family History: Family History  Problem Relation Age of Onset  . Cancer Mother   . Hyperlipidemia Father   . Hypertension Father   . Diabetes Father   . Cancer Maternal Aunt   . Cancer Maternal Uncle   . Cancer Maternal Grandmother   . Cancer Maternal Grandfather   . Diabetes Paternal Grandmother   . Hypertension Paternal Grandmother   . Hypertension Paternal Grandfather     Allergies: Allergies  Allergen Reactions  . Vancomycin Anaphylaxis    Prescriptions prior to admission  Medication Sig Dispense Refill Last Dose  . pantoprazole (PROTONIX) 40 MG tablet Take 1 tablet (40 mg total) by mouth daily. 30 tablet 1 06/10/2016 at Unknown time  . Prenatal MV-Min-FA-Omega-3 (PRENATAL GUMMIES/DHA &  FA PO) Take 2 each by mouth daily.   06/10/2016 at Unknown time  . promethazine (PHENERGAN) 25 MG tablet Take 1 tablet (25 mg total) by mouth every 6 (six) hours as needed for nausea or vomiting. 60 tablet 1 06/10/2016 at Unknown time  . metoCLOPramide (REGLAN) 10 MG tablet Take 1 tablet (10 mg total) by mouth 3 (three) times daily before meals. 60 tablet 1   . ondansetron (ZOFRAN-ODT) 8 MG disintegrating tablet Take 1 tablet (8 mg total) by mouth every 8 (eight) hours as needed for nausea or vomiting. 20 tablet 3 Taking     Prenatal Transfer Tool  Maternal Diabetes: No Genetic Screening: Declined Maternal Ultrasounds/Referrals: Normal Fetal Ultrasounds or other Referrals:  None Maternal Substance Abuse:  No Significant Maternal Medications:  None Significant Maternal Lab Results: Lab values include: Other: GBS unknown     Review of Systems   Constitutional: Negative for fever and chills Eyes: Negative for visual disturbances Respiratory: Negative for shortness of breath, dyspnea Cardiovascular: Negative for chest pain or palpitations  Gastrointestinal: Negative for vomiting, diarrhea and constipation.  POSITIVE for abdominal pain (contractions) Genitourinary: Negative for dysuria and urgency Musculoskeletal: Negative for back pain, joint pain, myalgias  Neurological: Negative for dizziness and headaches      Blood pressure 130/80, pulse 102, resp. rate 18, height 5\' 2"  (  1.575 m), weight 76.204 kg (168 lb), SpO2 100 %. General appearance: alert, cooperative and no distress Lungs: clear to auscultation bilaterally Heart: regular rate and rhythm Abdomen: soft, non-tender; bowel sounds normal Extremities: Homans sign is negative, no sign of DVT DTR's 2+ Presentation: cephalic Fetal monitoring  Baseline: 140 bpm, Variability: Good {> 6 bpm), Accelerations: Reactive and Decelerations: Absent Uterine activity  2-3  Dilation: 4.5 Effacement (%): 50, 60 Station: -1, 0 Exam by::  Camelia Eng RN   Prenatal labs: ABO, Rh:  pending Antibody:  pending Rubella: !Error!immune RPR: NON REAC (07/03 1412)  HBsAg: Negative (01/07 0755)  HIV: NONREACTIVE (07/03 1412)  GBS:   unknown 1 hr Glucola 72 Genetic screening  Too late Anatomy US normal   Results for orders placed or performed during the hospital encounter of 06/10/16 (from the past 24 hour(s))  Urinalysis, Routine w reflex microscopic (not at [facility])   Collection Time: 06/10/16  1:40 AM  Result Value Ref Range   Color, Urine YELLOW YELLOW   APPearance CLEAR CLEAR   Specific Gravity, Urine 1.005 1.005 - 1.030   pH 6.0 5.0 - 8.0   Glucose, UA NEGATIVE NEGATIVE mg/dL   Hgb urine dipstick NEGATIVE NEGATIVE   Bilirubin Urine NEGATIVE NEGATIVE   Ketones, ur NEGATIVE NEGATIVE mg/dL   Protein, ur NEGATIVE NEGATIVE mg/dL   Nitrite NEGATIVE NEGATIVE   Leukocytes, UA TRACE (A) NEGATIVE  Urine microscopic-add on   Collection Time: 06/10/16  1:40 AM  Result Value Ref Range   Squamous Epithelial / LPF NONE SEEN NONE SEEN   WBC, UA 0-5 0 - 5 WBC/hpf   RBC / HPF NONE SEEN 0 - 5 RBC/hpf   Bacteria, UA NONE SEEN NONE SEEN    Assessment: Ashley Harvey is a 20 y.o. G1P0 with an IUP at [redacted]w[redacted]d presenting for early PTL  Plan: #Labor: expectant management #Pain:  Per request #FWB Cat 1 #ID: GBS: unknown:  Get culture, tx w/PCN  #PTL:  BMZ 12 mg now #MOF:  breast  CRESENZO-DISHMAN,Khali Perella 06/10/2016, 11:47 PM

## 2016-06-10 NOTE — MAU Note (Signed)
Pt reports contractions q 3 minutes

## 2016-06-11 ENCOUNTER — Encounter (HOSPITAL_COMMUNITY): Payer: Self-pay | Admitting: *Deleted

## 2016-06-11 DIAGNOSIS — O2513 Malnutrition in pregnancy, third trimester: Secondary | ICD-10-CM | POA: Diagnosis present

## 2016-06-11 DIAGNOSIS — Z87891 Personal history of nicotine dependence: Secondary | ICD-10-CM | POA: Diagnosis not present

## 2016-06-11 DIAGNOSIS — Z3A35 35 weeks gestation of pregnancy: Secondary | ICD-10-CM | POA: Diagnosis not present

## 2016-06-11 DIAGNOSIS — O0933 Supervision of pregnancy with insufficient antenatal care, third trimester: Secondary | ICD-10-CM | POA: Diagnosis not present

## 2016-06-11 DIAGNOSIS — Z8249 Family history of ischemic heart disease and other diseases of the circulatory system: Secondary | ICD-10-CM | POA: Diagnosis not present

## 2016-06-11 DIAGNOSIS — Z833 Family history of diabetes mellitus: Secondary | ICD-10-CM | POA: Diagnosis not present

## 2016-06-11 DIAGNOSIS — IMO0001 Reserved for inherently not codable concepts without codable children: Secondary | ICD-10-CM

## 2016-06-11 LAB — TYPE AND SCREEN
ABO/RH(D): O POS
ANTIBODY SCREEN: NEGATIVE

## 2016-06-11 LAB — CBC
HEMATOCRIT: 29.2 % — AB (ref 36.0–46.0)
HEMOGLOBIN: 9.8 g/dL — AB (ref 12.0–15.0)
MCH: 29.8 pg (ref 26.0–34.0)
MCHC: 33.6 g/dL (ref 30.0–36.0)
MCV: 88.8 fL (ref 78.0–100.0)
Platelets: 279 10*3/uL (ref 150–400)
RBC: 3.29 MIL/uL — ABNORMAL LOW (ref 3.87–5.11)
RDW: 14 % (ref 11.5–15.5)
WBC: 19.6 10*3/uL — ABNORMAL HIGH (ref 4.0–10.5)

## 2016-06-11 LAB — ABO/RH: ABO/RH(D): O POS

## 2016-06-11 LAB — RPR: RPR Ser Ql: NONREACTIVE

## 2016-06-11 MED ORDER — PENICILLIN G POTASSIUM 5000000 UNITS IJ SOLR
5.0000 10*6.[IU] | Freq: Once | INTRAVENOUS | Status: AC
  Administered 2016-06-11: 5 10*6.[IU] via INTRAVENOUS
  Filled 2016-06-11: qty 5

## 2016-06-11 MED ORDER — FENTANYL CITRATE (PF) 100 MCG/2ML IJ SOLN
100.0000 ug | INTRAMUSCULAR | Status: DC | PRN
  Administered 2016-06-11: 100 ug via INTRAVENOUS
  Filled 2016-06-11: qty 2

## 2016-06-11 MED ORDER — SOD CITRATE-CITRIC ACID 500-334 MG/5ML PO SOLN: 30.0000 mL | ORAL | Status: DC | PRN

## 2016-06-11 MED ORDER — FLEET ENEMA 7-19 GM/118ML RE ENEM: 1.0000 | ENEMA | RECTAL | Status: DC | PRN

## 2016-06-11 MED ORDER — OXYCODONE-ACETAMINOPHEN 5-325 MG PO TABS
1.0000 | ORAL_TABLET | ORAL | Status: DC | PRN
Start: 2016-06-11 — End: 2016-06-11

## 2016-06-11 MED ORDER — ACETAMINOPHEN 325 MG PO TABS: 650.0000 mg | ORAL_TABLET | ORAL | Status: DC | PRN

## 2016-06-11 MED ORDER — PENICILLIN G POTASSIUM 5000000 UNITS IJ SOLR
2.5000 10*6.[IU] | INTRAVENOUS | Status: DC
  Administered 2016-06-11: 2.5 10*6.[IU] via INTRAVENOUS
  Filled 2016-06-11 (×3): qty 2.5

## 2016-06-11 MED ORDER — OXYCODONE-ACETAMINOPHEN 5-325 MG PO TABS
2.0000 | ORAL_TABLET | ORAL | Status: DC | PRN
Start: 2016-06-11 — End: 2016-06-11

## 2016-06-11 MED ORDER — OXYTOCIN 40 UNITS IN LACTATED RINGERS INFUSION - SIMPLE MED: 2.5000 [IU]/h | INTRAVENOUS | Status: DC

## 2016-06-11 MED ORDER — LACTATED RINGERS IV SOLN: 500.0000 mL | INTRAVENOUS | Status: DC | PRN

## 2016-06-11 MED ORDER — OXYTOCIN BOLUS FROM INFUSION: 500.0000 mL | INTRAVENOUS | Status: DC

## 2016-06-11 MED ORDER — ONDANSETRON HCL 4 MG/2ML IJ SOLN: 4.0000 mg | Freq: Four times a day (QID) | INTRAMUSCULAR | Status: DC | PRN

## 2016-06-11 MED ORDER — LIDOCAINE HCL (PF) 1 % IJ SOLN: 30.0000 mL | INTRAMUSCULAR | Status: DC | PRN

## 2016-06-11 NOTE — Discharge Summary (Signed)
Discharge Summary  Ashley Harvey is a 20 y.o. G1P0 at 624w0d  admitted for possible labor.   Subjective: Pt resting comfortably, no significant contractions at this time.   Objective: BP 112/68 mmHg  Pulse 91  Temp(Src) 97.9 F (36.6 C) (Oral)  Resp 18  Ht 5\' 2"  (1.575 m)  Wt 76.204 kg (168 lb)  BMI 30.72 kg/m2  SpO2 100%  LMP  or  Filed Vitals:   06/11/16 0057 06/11/16 0059 06/11/16 0450 06/11/16 0600  BP:  119/66 112/68   Pulse:  98 91   Temp: 98.1 F (36.7 C)  97.9 F (36.6 C)   TempSrc: Oral  Oral   Resp: 18  18 18   Height:      Weight:      SpO2:        Dilation: 3 Effacement (%): 60 Cervical Position: Middle Station: -1, -2 Presentation: Vertex Exam by:: Harraway-Smith  Labs: Lab Results  Component Value Date   WBC 19.6* 06/09/2016   HGB 9.8* 06/09/2016   HCT 29.2* 06/09/2016   MCV 88.8 06/09/2016   PLT 279 06/09/2016    Patient Active Problem List   Diagnosis Date Noted  . Active labor 06/11/2016  . Supervision of low-risk first pregnancy 05/31/2016  . Insufficient prenatal care in third trimester 05/31/2016  . Protein-calorie malnutrition, severe 12/09/2015  . Hyperemesis affecting pregnancy, antepartum 12/05/2015    Assessment / Plan: 20 y.o. G1P0 at 8024w0d here for possible labor. Pt resting comfortably, cervix 3cm, no significant contractions.  #preterm labor: [facility] Given strict return precautions.   Loni MuseKate Timberlake, MD 06/11/2016, 10:34 AM

## 2016-06-11 NOTE — Progress Notes (Signed)
No cervical change since admission.  Pt had a dose of Fentanyl, seemingly decreased uterine activity.  FHR cat 1.  Pt instructed not to take any more IV meds unless cervix starts changing,. Aware that she may possibly be dc'd if labor doesn't resume by this afternoon.

## 2016-06-11 NOTE — Discharge Instructions (Signed)
Return to the MAU tomorrow to get your second dose of steroid to help baby's lungs mature.   Preterm Labor Information Preterm labor is when labor starts at less than 37 weeks of pregnancy. The normal length of a pregnancy is 39 to 41 weeks. CAUSES Often, there is no identifiable underlying cause as to why a woman goes into preterm labor. One of the most common known causes of preterm labor is infection. Infections of the uterus, cervix, vagina, amniotic sac, bladder, kidney, or even the lungs (pneumonia) can cause labor to start. Other suspected causes of preterm labor include:   Urogenital infections, such as yeast infections and bacterial vaginosis.   Uterine abnormalities (uterine shape, uterine septum, fibroids, or bleeding from the placenta).   A cervix that has been operated on (it may fail to stay closed).   Malformations in the fetus.   Multiple gestations (twins, triplets, and so on).   Breakage of the amniotic sac.  RISK FACTORS  Having a previous history of preterm labor.   Having premature rupture of membranes (PROM).   Having a placenta that covers the opening of the cervix (placenta previa).   Having a placenta that separates from the uterus (placental abruption).   Having a cervix that is too weak to hold the fetus in the uterus (incompetent cervix).   Having too much fluid in the amniotic sac (polyhydramnios).   Taking illegal drugs or smoking while pregnant.   Not gaining enough weight while pregnant.   Being younger than 5618 and older than 20 years old.   Having a low socioeconomic status.   Being African American. SYMPTOMS Signs and symptoms of preterm labor include:   Menstrual-like cramps, abdominal pain, or back pain.  Uterine contractions that are regular, as frequent as six in an hour, regardless of their intensity (may be mild or painful).  Contractions that start on the top of the uterus and spread down to the lower abdomen and  back.   A sense of increased pelvic pressure.   A watery or bloody mucus discharge that comes from the vagina.  TREATMENT Depending on the length of the pregnancy and other circumstances, your health care provider may suggest bed rest. If necessary, there are medicines that can be given to stop contractions and to mature the fetal lungs. If labor happens before 34 weeks of pregnancy, a prolonged hospital stay may be recommended. Treatment depends on the condition of both you and the fetus.  WHAT SHOULD YOU DO IF YOU THINK YOU ARE IN PRETERM LABOR? Call your health care provider right away. You will need to go to the hospital to get checked immediately. HOW CAN YOU PREVENT PRETERM LABOR IN FUTURE PREGNANCIES? You should:   Stop smoking if you smoke.  Maintain healthy weight gain and avoid chemicals and drugs that are not necessary.  Be watchful for any type of infection.  Inform your health care provider if you have a known history of preterm labor.   This information is not intended to replace advice given to you by your health care provider. Make sure you discuss any questions you have with your health care provider.   Document Released: 02/05/2004 Document Revised: 07/18/2013 Document Reviewed: 12/18/2012 Elsevier Interactive Patient Education [employer]2016 Elsevier Inc.

## 2016-06-11 NOTE — Progress Notes (Signed)
Pt AVS signed and on chart, Dr. Chanetta Marshallimberlake advised pt to report to MAU first thing tomorrow for 2nd dose of BMZ. Pt voiced understanding.

## 2016-06-12 ENCOUNTER — Inpatient Hospital Stay (HOSPITAL_COMMUNITY)
Admission: AD | Admit: 2016-06-12 | Discharge: 2016-06-12 | Disposition: A | Discharge status: Home / Self Care | Payer: BC Managed Care – PPO | Source: Ambulatory Visit | Attending: Obstetrics & Gynecology | Admitting: Obstetrics & Gynecology

## 2016-06-12 DIAGNOSIS — Z3A00 Weeks of gestation of pregnancy not specified: Secondary | ICD-10-CM | POA: Insufficient documentation

## 2016-06-12 DIAGNOSIS — O4703 False labor before 37 completed weeks of gestation, third trimester: Secondary | ICD-10-CM

## 2016-06-12 MED ORDER — BETAMETHASONE SOD PHOS & ACET 6 (3-3) MG/ML IJ SUSP
12.0000 mg | Freq: Once | INTRAMUSCULAR | Status: AC
  Administered 2016-06-12: 12 mg via INTRAMUSCULAR
  Filled 2016-06-12: qty 2

## 2016-06-12 NOTE — Progress Notes (Signed)
Pt sent to MAU for second betamethasone injection. Denies any complications/cramping/bleeding/lof.

## 2016-06-13 LAB — CULTURE, BETA STREP (GROUP B ONLY)

## 2016-06-28 ENCOUNTER — Inpatient Hospital Stay (HOSPITAL_COMMUNITY)
Admission: AD | Admit: 2016-06-28 | Discharge: 2016-06-30 | DRG: 775 | Disposition: A | Discharge status: Home / Self Care | Payer: BC Managed Care – PPO | Source: Ambulatory Visit | Attending: Family Medicine | Admitting: Family Medicine

## 2016-06-28 ENCOUNTER — Encounter (HOSPITAL_COMMUNITY): Payer: Self-pay | Admitting: *Deleted

## 2016-06-28 ENCOUNTER — Inpatient Hospital Stay (HOSPITAL_COMMUNITY): Payer: BC Managed Care – PPO | Admitting: Anesthesiology

## 2016-06-28 DIAGNOSIS — Z87891 Personal history of nicotine dependence: Secondary | ICD-10-CM

## 2016-06-28 DIAGNOSIS — G4089 Other seizures: Secondary | ICD-10-CM | POA: Diagnosis present

## 2016-06-28 DIAGNOSIS — Z8249 Family history of ischemic heart disease and other diseases of the circulatory system: Secondary | ICD-10-CM | POA: Diagnosis not present

## 2016-06-28 DIAGNOSIS — Z3A38 38 weeks gestation of pregnancy: Secondary | ICD-10-CM

## 2016-06-28 DIAGNOSIS — Z30017 Encounter for initial prescription of implantable subdermal contraceptive: Secondary | ICD-10-CM

## 2016-06-28 DIAGNOSIS — O99354 Diseases of the nervous system complicating childbirth: Secondary | ICD-10-CM | POA: Diagnosis present

## 2016-06-28 DIAGNOSIS — Z3403 Encounter for supervision of normal first pregnancy, third trimester: Secondary | ICD-10-CM | POA: Diagnosis present

## 2016-06-28 DIAGNOSIS — Z833 Family history of diabetes mellitus: Secondary | ICD-10-CM | POA: Diagnosis not present

## 2016-06-28 LAB — CBC
HEMATOCRIT: 29.1 % — AB (ref 36.0–46.0)
HEMOGLOBIN: 9.9 g/dL — AB (ref 12.0–15.0)
MCH: 29.3 pg (ref 26.0–34.0)
MCHC: 34 g/dL (ref 30.0–36.0)
MCV: 86.1 fL (ref 78.0–100.0)
PLATELETS: 256 10*3/uL (ref 150–400)
RBC: 3.38 MIL/uL — AB (ref 3.87–5.11)
RDW: 14.5 % (ref 11.5–15.5)
WBC: 22.1 10*3/uL — AB (ref 4.0–10.5)

## 2016-06-28 LAB — TYPE AND SCREEN
ABO/RH(D): O POS
ANTIBODY SCREEN: NEGATIVE

## 2016-06-28 LAB — RPR: RPR Ser Ql: NONREACTIVE

## 2016-06-28 MED ORDER — LACTATED RINGERS IV SOLN: 500.0000 mL | Freq: Once | INTRAVENOUS | Status: DC

## 2016-06-28 MED ORDER — BENZOCAINE-MENTHOL 20-0.5 % EX AERO
1.0000 "application " | INHALATION_SPRAY | CUTANEOUS | Status: DC | PRN
  Administered 2016-06-28: 1 via TOPICAL
  Filled 2016-06-28: qty 56

## 2016-06-28 MED ORDER — FENTANYL 2.5 MCG/ML BUPIVACAINE 1/10 % EPIDURAL INFUSION (WH - ANES)
INTRAMUSCULAR | Status: AC
  Filled 2016-06-28: qty 125

## 2016-06-28 MED ORDER — EPHEDRINE 5 MG/ML INJ
10.0000 mg | INTRAVENOUS | Status: DC | PRN
  Filled 2016-06-28: qty 4

## 2016-06-28 MED ORDER — IBUPROFEN 600 MG PO TABS
600.0000 mg | ORAL_TABLET | Freq: Four times a day (QID) | ORAL | Status: DC
  Administered 2016-06-28 – 2016-06-30 (×7): 600 mg via ORAL
  Filled 2016-06-28 (×7): qty 1

## 2016-06-28 MED ORDER — DIPHENHYDRAMINE HCL 50 MG/ML IJ SOLN: 12.5000 mg | INTRAMUSCULAR | Status: DC | PRN

## 2016-06-28 MED ORDER — OXYTOCIN 40 UNITS IN LACTATED RINGERS INFUSION - SIMPLE MED: 2.5000 [IU]/h | INTRAVENOUS | Status: DC

## 2016-06-28 MED ORDER — ONDANSETRON HCL 4 MG PO TABS: 4.0000 mg | ORAL_TABLET | ORAL | Status: DC | PRN

## 2016-06-28 MED ORDER — PHENYLEPHRINE 40 MCG/ML (10ML) SYRINGE FOR IV PUSH (FOR BLOOD PRESSURE SUPPORT)
80.0000 ug | PREFILLED_SYRINGE | INTRAVENOUS | Status: DC | PRN
  Filled 2016-06-28: qty 5

## 2016-06-28 MED ORDER — COCONUT OIL OIL
1.0000 "application " | TOPICAL_OIL | Status: DC | PRN
  Administered 2016-06-30: 1 via TOPICAL
  Filled 2016-06-28: qty 120

## 2016-06-28 MED ORDER — LIDOCAINE HCL (PF) 1 % IJ SOLN
30.0000 mL | INTRAMUSCULAR | Status: AC | PRN
  Administered 2016-06-28: 30 mL via SUBCUTANEOUS
  Filled 2016-06-28: qty 30

## 2016-06-28 MED ORDER — OXYTOCIN 40 UNITS IN LACTATED RINGERS INFUSION - SIMPLE MED
1.0000 m[IU]/min | INTRAVENOUS | Status: DC
  Administered 2016-06-28: 2 m[IU]/min via INTRAVENOUS
  Filled 2016-06-28: qty 1000

## 2016-06-28 MED ORDER — ONDANSETRON HCL 4 MG/2ML IJ SOLN: 4.0000 mg | INTRAMUSCULAR | Status: DC | PRN

## 2016-06-28 MED ORDER — ONDANSETRON HCL 4 MG/2ML IJ SOLN: 4.0000 mg | Freq: Four times a day (QID) | INTRAMUSCULAR | Status: DC | PRN

## 2016-06-28 MED ORDER — ACETAMINOPHEN 325 MG PO TABS: 650.0000 mg | ORAL_TABLET | ORAL | Status: DC | PRN

## 2016-06-28 MED ORDER — WITCH HAZEL-GLYCERIN EX PADS: 1.0000 "application " | MEDICATED_PAD | CUTANEOUS | Status: DC | PRN

## 2016-06-28 MED ORDER — DIPHENHYDRAMINE HCL 25 MG PO CAPS: 25.0000 mg | ORAL_CAPSULE | Freq: Four times a day (QID) | ORAL | Status: DC | PRN

## 2016-06-28 MED ORDER — PRENATAL MULTIVITAMIN CH
1.0000 | ORAL_TABLET | Freq: Every day | ORAL | Status: DC
  Administered 2016-06-29 – 2016-06-30 (×2): 1 via ORAL
  Filled 2016-06-28 (×2): qty 1

## 2016-06-28 MED ORDER — SIMETHICONE 80 MG PO CHEW: 80.0000 mg | CHEWABLE_TABLET | ORAL | Status: DC | PRN

## 2016-06-28 MED ORDER — PHENYLEPHRINE 40 MCG/ML (10ML) SYRINGE FOR IV PUSH (FOR BLOOD PRESSURE SUPPORT)
PREFILLED_SYRINGE | INTRAVENOUS | Status: AC
  Filled 2016-06-28: qty 20

## 2016-06-28 MED ORDER — FLEET ENEMA 7-19 GM/118ML RE ENEM: 1.0000 | ENEMA | RECTAL | Status: DC | PRN

## 2016-06-28 MED ORDER — SOD CITRATE-CITRIC ACID 500-334 MG/5ML PO SOLN: 30.0000 mL | ORAL | Status: DC | PRN

## 2016-06-28 MED ORDER — OXYCODONE-ACETAMINOPHEN 5-325 MG PO TABS: 2.0000 | ORAL_TABLET | ORAL | Status: DC | PRN

## 2016-06-28 MED ORDER — LACTATED RINGERS IV SOLN
INTRAVENOUS | Status: DC
  Administered 2016-06-28 (×3): via INTRAVENOUS

## 2016-06-28 MED ORDER — LIDOCAINE HCL (PF) 1 % IJ SOLN
INTRAMUSCULAR | Status: DC | PRN
  Administered 2016-06-28 (×2): 4 mL

## 2016-06-28 MED ORDER — ACETAMINOPHEN 325 MG PO TABS
650.0000 mg | ORAL_TABLET | ORAL | Status: DC | PRN
  Administered 2016-06-29 (×2): 650 mg via ORAL
  Filled 2016-06-28 (×2): qty 2

## 2016-06-28 MED ORDER — OXYCODONE-ACETAMINOPHEN 5-325 MG PO TABS: 1.0000 | ORAL_TABLET | ORAL | Status: DC | PRN

## 2016-06-28 MED ORDER — SENNOSIDES-DOCUSATE SODIUM 8.6-50 MG PO TABS
2.0000 | ORAL_TABLET | ORAL | Status: DC
  Administered 2016-06-28 – 2016-06-29 (×2): 2 via ORAL
  Filled 2016-06-28 (×2): qty 2

## 2016-06-28 MED ORDER — TETANUS-DIPHTH-ACELL PERTUSSIS 5-2.5-18.5 LF-MCG/0.5 IM SUSP
0.5000 mL | Freq: Once | INTRAMUSCULAR | Status: AC
  Administered 2016-06-29: 0.5 mL via INTRAMUSCULAR
  Filled 2016-06-28: qty 0.5

## 2016-06-28 MED ORDER — DIBUCAINE 1 % RE OINT
1.0000 "application " | TOPICAL_OINTMENT | RECTAL | Status: DC | PRN
  Administered 2016-06-28: 1 via RECTAL
  Filled 2016-06-28: qty 28

## 2016-06-28 MED ORDER — LACTATED RINGERS IV SOLN
500.0000 mL | INTRAVENOUS | Status: DC | PRN
  Administered 2016-06-28 (×2): 500 mL via INTRAVENOUS

## 2016-06-28 MED ORDER — OXYTOCIN BOLUS FROM INFUSION
500.0000 mL | Freq: Once | INTRAVENOUS | Status: AC
  Administered 2016-06-28: 500 mL via INTRAVENOUS

## 2016-06-28 MED ORDER — FENTANYL 2.5 MCG/ML BUPIVACAINE 1/10 % EPIDURAL INFUSION (WH - ANES)
14.0000 mL/h | INTRAMUSCULAR | Status: DC | PRN
  Administered 2016-06-28 (×3): 14 mL/h via EPIDURAL
  Filled 2016-06-28 (×2): qty 125

## 2016-06-28 MED ORDER — ZOLPIDEM TARTRATE 5 MG PO TABS: 5.0000 mg | ORAL_TABLET | Freq: Every evening | ORAL | Status: DC | PRN

## 2016-06-28 NOTE — MAU Note (Signed)
Pt presents complaining of contractions every 10-15 minutes. Denies leaking or bleeding. Reports good fetal movement.

## 2016-06-28 NOTE — Anesthesia Preprocedure Evaluation (Signed)
Anesthesia Evaluation  Patient identified by MRN, date of birth, ID band Patient awake    Reviewed: Allergy & Precautions, NPO status , Patient's Chart, lab work & pertinent test results  History of Anesthesia Complications Negative for: history of anesthetic complications  Airway Mallampati: II  TM Distance: >3 FB Neck ROM: Full    Dental no notable dental hx. (+) Dental Advisory Given   Pulmonary former smoker,    Pulmonary exam normal breath sounds clear to auscultation       Cardiovascular negative cardio ROS Normal cardiovascular exam Rhythm:Regular Rate:Normal     Neuro/Psych Seizures - (reports being stress related, not taking any medication),  negative psych ROS   GI/Hepatic negative GI ROS, Neg liver ROS,   Endo/Other  negative endocrine ROS  Renal/GU negative Renal ROS  negative genitourinary   Musculoskeletal negative musculoskeletal ROS (+)   Abdominal   Peds negative pediatric ROS (+)  Hematology negative hematology ROS (+)   Anesthesia Other Findings   Reproductive/Obstetrics (+) Pregnancy                             Anesthesia Physical Anesthesia Plan  ASA: II  Anesthesia Plan: Epidural   Post-op Pain Management:    Induction:   Airway Management Planned:   Additional Equipment:   Intra-op Plan:   Post-operative Plan:   Informed Consent: I have reviewed the patients History and Physical, chart, labs and discussed the procedure including the risks, benefits and alternatives for the proposed anesthesia with the patient or authorized representative who has indicated his/her understanding and acceptance.   Dental advisory given  Plan Discussed with: CRNA  Anesthesia Plan Comments:         Anesthesia Quick Evaluation

## 2016-06-28 NOTE — H&P (Signed)
Ashley Harvey is a 20 y.o. female presenting for contractions that started at 2000.  Contractions reported as occurring every 10-15 minutes.  Denies vaginal bleeding or leaking of fluid.  +fetal movement.  Insufficient care in pregnancy with first visit at [facility] at 34 wks.  Some prenatal care in [place] prior to that .  Pregnancy dated by 11 wk ultrasound in [place].  Pregnancy complicated by hyperemesis in early pregnancy and admissions for preterm contractions.  BMZ given on 06/10/16 and 06/12/16.  Medical history included stress induced seizures.    OB History    Gravida Para Term Preterm AB Living   1             SAB TAB Ectopic Multiple Live Births                 Past Medical History:  Diagnosis Date  . Seizures (HCC)    [redacted] weeks pregnant; stress induced, most recent one @ 28 wks   Past Surgical History:  Procedure Laterality Date  . NO PAST SURGERIES     Family History: family history includes Cancer in her maternal aunt, maternal grandfather, maternal grandmother, maternal uncle, and mother; Diabetes in her father and paternal grandmother; Hyperlipidemia in her father; Hypertension in her father, paternal grandfather, and paternal grandmother. Social History:  reports that she quit smoking about 7 months ago. She has never used smokeless tobacco. She reports that she uses drugs, including Marijuana, about 7 times per week. She reports that she does not drink alcohol.     Maternal Diabetes: No Genetic Screening:  Late to care Maternal Ultrasounds/Referrals: Normal @ 34 wks Fetal Ultrasounds or other Referrals:  None Maternal Substance Abuse:  Yes:  Type: Marijuana Significant Maternal Medications:  None Significant Maternal Lab Results:  Lab values include: Group B Strep negative Other Comments:  Stress induced maternal seizures  ROS Maternal Medical History:  Reason for admission: Contractions.   Contractions: Onset was 3-5 hours ago.   Frequency: regular.    Perceived severity is moderate.    Fetal activity: Perceived fetal activity is normal.   Last perceived fetal movement was within the past hour.    Prenatal complications: Substance abuse.   No bleeding.     Dilation: 6 Effacement (%): 60 Station: -2 Exam by:: K. WeissRN There were no vitals taken for this visit. Maternal Exam:  Uterine Assessment: Contraction strength is moderate.  Contraction frequency is irregular.   Abdomen: Patient reports no abdominal tenderness. Estimated fetal weight is 6.5 - 7lbs.   Fetal presentation: vertex  Introitus: Vagina is positive for vaginal discharge (mucusy).    Fetal Exam Fetal Monitor Review: Baseline rate: 130's.  Variability: moderate (6-25 bpm).   Pattern: accelerations present and variable decelerations.    Fetal State Assessment: Category II - tracings are indeterminate.     Physical Exam  Constitutional: She is oriented to person, place, and time. She appears well-developed and well-nourished.  HENT:  Head: Normocephalic.  Neck: Normal range of motion. Neck supple.  Cardiovascular: Normal rate, regular rhythm and normal heart sounds.   Respiratory: Effort normal and breath sounds normal. No respiratory distress.  GI: Soft. There is no tenderness.  Genitourinary: No bleeding in the vagina. Vaginal discharge (mucusy) found.  Musculoskeletal: Normal range of motion. She exhibits no edema.  Neurological: She is alert and oriented to person, place, and time.  Skin: Skin is warm and dry.    Prenatal labs: ABO, Rh: --/--/O POS (07/13 2342) Antibody:  NEG (07/12 2342) Rubella: 1.28 (01/07 0755) RPR: Non Reactive (07/12 2342)  HBsAg: Negative (01/07 0755)  HIV: NONREACTIVE (07/03 1412)  GBS:     Assessment/Plan: 20 y.o. G1P0 at [redacted]w[redacted]d IUP Active Labor GBS negative Category II Fetal Tracing (variable x 2)  Plan: Admit to [employer] Orders given to RN by Zerita Boers, CNM Anticipate NSVD   Ashley Harvey 06/28/2016, 2:49 AM

## 2016-06-28 NOTE — Progress Notes (Signed)
PT seen and evaluated. Cervical check preformed 6 50 -1, AROM at 0952. Pitocin started.

## 2016-06-28 NOTE — Anesthesia Pain Management Evaluation Note (Signed)
  CRNA Pain Management Visit Note  Patient: Ashley Harvey, 20 y.o., female  "Hello I am a member of the anesthesia team at [facility]. We have an anesthesia team available at all times to provide care throughout the hospital, including epidural management and anesthesia for C-section. I don't know your plan for the delivery whether it a natural birth, water birth, IV sedation, nitrous supplementation, doula or epidural, but we want to meet your pain goals."   1.Was your pain managed to your expectations on prior hospitalizations?     2.What is your expectation for pain management during this hospitalization?      3.How can we help you reach that goal?   Record the patient's initial score and the patient's pain goal.   Pain: 0  Pain Goal: 4 [facility] wants you to be able to say your pain was always managed very well.  Laban Emperor 06/28/2016

## 2016-06-28 NOTE — Lactation Note (Signed)
This note was copied from a baby's chart. Lactation Consultation Note  Patient Name: Ashley Harvey [id number] Date: 06/28/2016 Reason for consult: Initial assessment Baby at 3 hr of life. Mom reports baby is latching well. She denies breast or nipple pain, voiced no concerns. She has a Medela DEBP at home but needs new tubing. Discussed baby behavior, feeding frequency, baby belly size, voids, wt loss, breast changes, and nipple care. Mom stated she can manually express, reviewed how to spoon feed. Given lactation handouts. Aware of OP services and support group.    Maternal Data Has patient been taught Hand Expression?: Yes Does the patient have breastfeeding experience prior to this delivery?: No  Feeding Feeding Type: Breast Fed Length of feed: 10 min  LATCH Score/Interventions Latch: Grasps breast easily, tongue down, lips flanged, rhythmical sucking. Intervention(s): Skin to skin;Teach feeding cues  Audible Swallowing: A few with stimulation Intervention(s): Skin to skin;Hand expression Intervention(s): Skin to skin;Hand expression  Type of Nipple: Flat Intervention(s): No intervention needed  Comfort (Breast/Nipple): Soft / non-tender     Hold (Positioning): Assistance needed to correctly position infant at breast and maintain latch. Intervention(s): Breastfeeding basics reviewed;Support Pillows;Position options;Skin to skin  LATCH Score: 7  Lactation Tools Discussed/Used WIC Program: Yes   Consult Status Consult Status: Follow-up Date: 06/29/16 Follow-up type: In-patient    Ashley Harvey 06/28/2016, 9:21 PM

## 2016-06-28 NOTE — Progress Notes (Signed)
Fedora Gamache is a 20 y.o. G1P0 at [redacted]w[redacted]d by ultrasound admitted for active labor  Subjective:   Objective: BP 125/74   Pulse (!) 110   Temp 98.6 F (37 C) (Oral)   Resp 16   Ht 5\' 2"  (1.575 m)   Wt 177 lb (80.3 kg)   SpO2 98%   BMI 32.37 kg/m  No intake/output data recorded. No intake/output data recorded.  FHT:  FHR: 135 bpm, variability: moderate,  accelerations:  Present,  decelerations:  Absent UC:   regular, every 4-5 minutes SVE:   Dilation: 6 Effacement (%): 60 Station: -2 Exam by:: K. WeissRN  Labs: Lab Results  Component Value Date   WBC 22.1 (H) 06/28/2016   HGB 9.9 (L) 06/28/2016   HCT 29.1 (L) 06/28/2016   MCV 86.1 06/28/2016   PLT 256 06/28/2016    Assessment / Plan: Spontaneous labor, progressing normally  Labor: Progressing normally Preeclampsia:  no signs or symptoms of toxicity and intake and ouput balanced Fetal Wellbeing:  Category I Pain Control:  Epidural I/D:  n/a Anticipated MOD:  NSVD  Wyvonnia Dusky 06/28/2016, 4:19 AM

## 2016-06-28 NOTE — Anesthesia Procedure Notes (Signed)
Epidural Patient location during procedure: OB  Staffing Anesthesiologist: Karie Schwalbe Performed: anesthesiologist   Preanesthetic Checklist Completed: patient identified, site marked, surgical consent, pre-op evaluation, timeout performed, IV checked, risks and benefits discussed and monitors and equipment checked  Epidural Patient position: sitting Prep: site prepped and draped and DuraPrep Patient monitoring: continuous pulse ox and blood pressure Approach: midline Location: L3-L4 Injection technique: LOR saline  Needle:  Needle type: Tuohy  Needle gauge: 17 G Needle length: 9 cm and 9 Needle insertion depth: 5 cm cm Catheter type: closed end flexible Catheter size: 19 Gauge Catheter at skin depth: 10 cm Test dose: negative  Assessment Events: blood aspirated (First catheter easily placed but heme aspirated prior to injection, removed, and new catheter placed with negative aspiration for heme prior to injection), injection not painful, no injection resistance, negative IV test and no paresthesia  Additional Notes Patient identified. Risks/Benefits/Options discussed with patient including but not limited to bleeding, infection, nerve damage, paralysis, failed block, incomplete pain control, headache, blood pressure changes, nausea, vomiting, reactions to medication both or allergic, itching and postpartum back pain. Confirmed with bedside nurse the patient's most recent platelet count. Confirmed with patient that they are not currently taking any anticoagulation, have any bleeding history or any family history of bleeding disorders. Patient expressed understanding and wished to proceed. All questions were answered. Sterile technique was used throughout the entire procedure. Please see nursing notes for vital signs. Test dose was given through epidural catheter and negative prior to continuing to dose epidural or start infusion. Warning signs of high block given to the patient  including shortness of breath, tingling/numbness in hands, complete motor block, or any concerning symptoms with instructions to call for help. Patient was given instructions on fall risk and not to get out of bed. All questions and concerns addressed with instructions to call with any issues or inadequate analgesia.

## 2016-06-29 ENCOUNTER — Encounter: Payer: BC Managed Care – PPO | Admitting: Family

## 2016-06-29 MED ORDER — LIDOCAINE HCL 1 % IJ SOLN
0.0000 mL | Freq: Once | INTRAMUSCULAR | Status: AC | PRN
  Administered 2016-06-30: 20 mL via INTRADERMAL
  Filled 2016-06-29: qty 20

## 2016-06-29 MED ORDER — ETONOGESTREL 68 MG ~~LOC~~ IMPL
68.0000 mg | DRUG_IMPLANT | Freq: Once | SUBCUTANEOUS | Status: AC
  Administered 2016-06-30: 68 mg via SUBCUTANEOUS
  Filled 2016-06-29: qty 1

## 2016-06-29 NOTE — Anesthesia Postprocedure Evaluation (Signed)
Anesthesia Post Note  Patient: Vinie Sill  Procedure(s) Performed: * No procedures listed *  Patient location during evaluation: Mother Baby Anesthesia Type: Epidural Level of consciousness: awake and alert Pain management: pain level controlled Vital Signs Assessment: post-procedure vital signs reviewed and stable Respiratory status: spontaneous breathing, nonlabored ventilation and respiratory function stable Cardiovascular status: stable Postop Assessment: no headache, no backache and epidural receding Anesthetic complications: no     Last Vitals:  Vitals:   06/29/16 0130 06/29/16 0610  BP: (!) 108/58 112/68  Pulse: 77 84  Resp: 18 18  Temp: 36.8 C 36.8 C    Last Pain:  Vitals:   06/29/16 0720  TempSrc:   PainSc: 0-No pain   Pain Goal: Patients Stated Pain Goal: 4 (06/28/16 1218)               Junious Silk

## 2016-06-29 NOTE — Progress Notes (Signed)
Post Partum Day 1  Subjective:  Ashley Harvey is a 20 y.o. G1P1001 [redacted]w[redacted]d s/p SVD.  No acute events overnight.  Pt denies problems with ambulating, voiding or po intake.  She denies nausea or vomiting.  Pain is well controlled.  She has had flatus. She has not had bowel movement.  Lochia Small.  Plan for birth control is inpatient nexplanon.  Method of Feeding: breast  Objective: BP 112/68   Pulse 84   Temp 98.2 F (36.8 C)   Resp 18   Ht 5\' 2"  (1.575 m)   Wt 80.3 kg (177 lb)   SpO2 97%   Breastfeeding? Unknown   BMI 32.37 kg/m   Physical Exam:  General: alert, cooperative and no distress Lochia:normal flow Chest: CTAB Heart: RRR no m/r/g Abdomen: +BS, soft, nontender, fundus firm at/below umbilicus Uterine Fundus: firm, nontender DVT Evaluation: No evidence of DVT seen on physical exam. Extremities: no edema   Recent Labs  06/28/16 0224  HGB 9.9*  HCT 29.1*    Assessment/Plan:  ASSESSMENT: Ashley Harvey is a 20 y.o. G1P1001 [redacted]w[redacted]d ppd #1 s/p NSVD doing well.   Plan for discharge tomorrow, Breastfeeding and Contraception inpatient nexplanon   LOS: 1 day   Loni Muse 06/29/2016, 7:26 AM    OB FELLOW POSTPARTUM PROGRESS NOTE ATTESTATION  I have seen and examined this patient and agree with above documentation in the resident's note.   Jen Mow, DO  OB Fellow 7:20 AM

## 2016-06-29 NOTE — Lactation Note (Signed)
This note was copied from a baby's chart. Lactation Consultation Note  Patient Name: Ashley Harvey Date: 06/29/2016 Reason for consult: Follow-up assessment Assisted Mom with positioning and latching baby. After few attempts and using breast compression baby latched demonstrating some good suckling bursts, swallows noted. Baby sleepy off/on, demonstrated to Mom ways to keep baby active at breast. Basic teaching reviewed. Encouraged to BF with feeding ques, discussed cluster feeding. Encouraged to call for assist as needed with latch.   Maternal Data Has patient been taught Hand Expression?: Yes Does the patient have breastfeeding experience prior to this delivery?: No  Feeding Feeding Type: Breast Fed Length of feed: 13 min  LATCH Score/Interventions Latch: Repeated attempts needed to sustain latch, nipple held in mouth throughout feeding, stimulation needed to elicit sucking reflex. Intervention(s): Adjust position;Assist with latch;Breast massage;Breast compression  Audible Swallowing: A few with stimulation Intervention(s): Skin to skin;Hand expression  Type of Nipple: Everted at rest and after stimulation  Comfort (Breast/Nipple): Soft / non-tender     Hold (Positioning): Assistance needed to correctly position infant at breast and maintain latch. Intervention(s): Breastfeeding basics reviewed;Support Pillows;Position options;Skin to skin  LATCH Score: 7  Lactation Tools Discussed/Used     Consult Status Consult Status: Follow-up Date: 06/30/16 Follow-up type: In-patient    Alfred Levins 06/29/2016, 2:12 PM

## 2016-06-30 DIAGNOSIS — Z30017 Encounter for initial prescription of implantable subdermal contraceptive: Secondary | ICD-10-CM

## 2016-06-30 MED ORDER — IBUPROFEN 600 MG PO TABS: 600.0000 mg | ORAL_TABLET | Freq: Four times a day (QID) | ORAL | 0 refills | Status: DC

## 2016-06-30 MED ORDER — DOCUSATE SODIUM 100 MG PO CAPS: 100.0000 mg | ORAL_CAPSULE | Freq: Two times a day (BID) | ORAL | 0 refills | Status: DC

## 2016-06-30 MED ORDER — ACETAMINOPHEN 325 MG PO TABS: 650.0000 mg | ORAL_TABLET | ORAL | 1 refills | Status: DC | PRN

## 2016-06-30 NOTE — Discharge Instructions (Signed)
Postpartum Care After Vaginal Delivery °After you deliver your newborn (postpartum period), the usual stay in the hospital is 24-72 hours. If there were problems with your labor or delivery, or if you have other medical problems, you might be in the hospital longer.  °While you are in the hospital, you will receive help and instructions on how to care for yourself and your newborn during the postpartum period.  °While you are in the hospital: °· Be sure to tell your nurses if you have pain or discomfort, as well as where you feel the pain and what makes the pain worse. °· If you had an incision made near your vagina (episiotomy) or if you had some tearing during delivery, the nurses may put ice packs on your episiotomy or tear. The ice packs may help to reduce the pain and swelling. °· If you are breastfeeding, you may feel uncomfortable contractions of your uterus for a couple of weeks. This is normal. The contractions help your uterus get back to normal size. °· It is normal to have some bleeding after delivery. °¨ For the first 1-3 days after delivery, the flow is red and the amount may be similar to a period. °¨ It is common for the flow to start and stop. °¨ In the first few days, you may pass some small clots. Let your nurses know if you begin to pass large clots or your flow increases. °¨ Do not  flush blood clots down the toilet before having the nurse look at them. °¨ During the next 3-10 days after delivery, your flow should become more watery and pink or brown-tinged in color. °¨ Ten to fourteen days after delivery, your flow should be a small amount of yellowish-white discharge. °¨ The amount of your flow will decrease over the first few weeks after delivery. Your flow may stop in 6-8 weeks. Most women have had their flow stop by 12 weeks after delivery. °· You should change your sanitary pads frequently. °· Wash your hands thoroughly with soap and water for at least 20 seconds after changing pads, using  the toilet, or before holding or feeding your newborn. °· You should feel like you need to empty your bladder within the first 6-8 hours after delivery. °· In case you become weak, lightheaded, or faint, call your nurse before you get out of bed for the first time and before you take a shower for the first time. °· Within the first few days after delivery, your breasts may begin to feel tender and full. This is called engorgement. Breast tenderness usually goes away within 48-72 hours after engorgement occurs. You may also notice milk leaking from your breasts. If you are not breastfeeding, do not stimulate your breasts. Breast stimulation can make your breasts produce more milk. °· Spending as much time as possible with your newborn is very important. During this time, you and your newborn can feel close and get to know each other. Having your newborn stay in your room (rooming in) will help to strengthen the bond with your newborn.  It will give you time to get to know your newborn and become comfortable caring for your newborn. °· Your hormones change after delivery. Sometimes the hormone changes can temporarily cause you to feel sad or tearful. These feelings should not last more than a few days. If these feelings last longer than that, you should talk to your caregiver. °· If desired, talk to your caregiver about methods of family planning or contraception. °·   Talk to your caregiver about immunizations. Your caregiver may want you to have the following immunizations before leaving the hospital:  Tetanus, diphtheria, and pertussis (Tdap) or tetanus and diphtheria (Td) immunization. It is very important that you and your family (including grandparents) or others caring for your newborn are up-to-date with the Tdap or Td immunizations. The Tdap or Td immunization can help protect your newborn from getting ill.  Rubella immunization.  Varicella (chickenpox) immunization.  Influenza immunization. You should  receive this annual immunization if you did not receive the immunization during your pregnancy.   This information is not intended to replace advice given to you by your health care provider. Make sure you discuss any questions you have with your health care provider.   Document Released: 09/12/2007 Document Revised: 08/09/2012 Document Reviewed: 07/12/2012 Elsevier Interactive Patient Education 2016 [employer].   Etonogestrel implant What is this medicine? ETONOGESTREL (et oh noe JES trel) is a contraceptive (birth control) device. It is used to prevent pregnancy. It can be used for up to 3 years. This medicine may be used for other purposes; ask your health care provider or pharmacist if you have questions. What should I tell my health care provider before I take this medicine? They need to know if you have any of these conditions: -abnormal vaginal bleeding -blood vessel disease or blood clots -cancer of the breast, cervix, or liver -depression -diabetes -gallbladder disease -headaches -heart disease or recent heart attack -high blood pressure -high cholesterol -kidney disease -liver disease -renal disease -seizures -tobacco smoker -an unusual or allergic reaction to etonogestrel, other hormones, anesthetics or antiseptics, medicines, foods, dyes, or preservatives -pregnant or trying to get pregnant -breast-feeding How should I use this medicine? This device is inserted just under the skin on the inner side of your upper arm by a health care professional. Talk to your pediatrician regarding the use of this medicine in children. Special care may be needed. Overdosage: If you think you have taken too much of this medicine contact a poison control center or emergency room at once. NOTE: This medicine is only for you. Do not share this medicine with others. What if I miss a dose? This does not apply. What may interact with this medicine? Do not take this medicine with any of  the following medications: -amprenavir -bosentan -fosamprenavir This medicine may also interact with the following medications: -barbiturate medicines for inducing sleep or treating seizures -certain medicines for fungal infections like ketoconazole and itraconazole -griseofulvin -medicines to treat seizures like carbamazepine, felbamate, oxcarbazepine, phenytoin, topiramate -modafinil -phenylbutazone -rifampin -some medicines to treat HIV infection like atazanavir, indinavir, lopinavir, nelfinavir, tipranavir, ritonavir -St. John's wort This list may not describe all possible interactions. Give your health care provider a list of all the medicines, herbs, non-prescription drugs, or dietary supplements you use. Also tell them if you smoke, drink alcohol, or use illegal drugs. Some items may interact with your medicine. What should I watch for while using this medicine? This product does not protect you against HIV infection (AIDS) or other sexually transmitted diseases. You should be able to feel the implant by pressing your fingertips over the skin where it was inserted. Contact your doctor if you cannot feel the implant, and use a non-hormonal birth control method (such as condoms) until your doctor confirms that the implant is in place. If you feel that the implant may have broken or become bent while in your arm, contact your healthcare provider. What side effects may I notice  from receiving this medicine? Side effects that you should report to your doctor or health care professional as soon as possible: -allergic reactions like skin rash, itching or hives, swelling of the face, lips, or tongue -breast lumps -changes in emotions or moods -depressed mood -heavy or prolonged menstrual bleeding -pain, irritation, swelling, or bruising at the insertion site -scar at site of insertion -signs of infection at the insertion site such as fever, and skin redness, pain or discharge -signs of  pregnancy -signs and symptoms of a blood clot such as breathing problems; changes in vision; chest pain; severe, sudden headache; pain, swelling, warmth in the leg; trouble speaking; sudden numbness or weakness of the face, arm or leg -signs and symptoms of liver injury like dark yellow or brown urine; general ill feeling or flu-like symptoms; light-colored stools; loss of appetite; nausea; right upper belly pain; unusually weak or tired; yellowing of the eyes or skin -unusual vaginal bleeding, discharge -signs and symptoms of a stroke like changes in vision; confusion; trouble speaking or understanding; severe headaches; sudden numbness or weakness of the face, arm or leg; trouble walking; dizziness; loss of balance or coordination Side effects that usually do not require medical attention (Report these to your doctor or health care professional if they continue or are bothersome.): -acne -back pain -breast pain -changes in weight -dizziness -general ill feeling or flu-like symptoms -headache -irregular menstrual bleeding -nausea -sore throat -vaginal irritation or inflammation This list may not describe all possible side effects. Call your doctor for medical advice about side effects. You may report side effects to FDA at 1-800-FDA-1088. Where should I keep my medicine? This drug is given in a hospital or clinic and will not be stored at home. NOTE: This sheet is a summary. It may not cover all possible information. If you have questions about this medicine, talk to your doctor, pharmacist, or health care provider.    2016, Elsevier/Gold Standard. (2014-08-30 14:07:06)

## 2016-06-30 NOTE — Discharge Summary (Signed)
OB Discharge Summary     Patient Name: Ashley Harvey DOB: [birth year] MRN: [id number]  Date of admission: 06/28/2016 Delivering MD: Garth Bigness   Date of discharge: 06/30/2016  Admitting diagnosis: 38 WEEKS CTX Intrauterine pregnancy: [redacted]w[redacted]d     Secondary diagnosis:  Active Problems:   Normal labor   Normal spontaneous vaginal delivery   Nexplanon insertion  Additional problems: Teen pregnancy     Discharge diagnosis: Term Pregnancy Delivered                                                                                                Post partum procedures: Nexplanon insertion  Augmentation: AROM and Pitocin  Complications: None  Hospital course:  Onset of Labor With Vaginal Delivery     20 y.o. yo G1P1001 at [redacted]w[redacted]d was admitted in Active Labor on 06/28/2016. Patient had an uncomplicated labor course as follows:  Membrane Rupture Time/Date: 9:46 AM ,06/28/2016   Intrapartum Procedures: Episiotomy: None [1]                                         Lacerations:  Labial [10]  Patient had a delivery of a Viable infant. 06/28/2016  Information for the patient's newborn:  Ommie, Degeorge [[id number]]  Delivery Method: Vaginal, Spontaneous Delivery (Filed from Delivery Summary)    Pateint had an uncomplicated postpartum course.  She is ambulating, tolerating a regular diet, passing flatus, and urinating well. Patient is discharged home in stable condition on 06/30/16.    Physical exam Vitals:   06/29/16 0610 06/29/16 0910 06/29/16 1853 06/30/16 0553  BP: 112/68 (!) 109/56 127/84 125/78  Pulse: 84 80 93 87  Resp: Temp: 98.2 F (36.8 C) 98.4 F (36.9 C) 99 F (37.2 C) 98.5 F (36.9 C)  TempSrc:  Oral Oral   SpO2:      Weight:      Height:       General: alert, cooperative and no distress Lochia: appropriate Uterine Fundus: firm Incision: N/A DVT Evaluation: No evidence of DVT seen on physical exam. Negative Homan's sign. Labs: Lab  Results  Component Value Date   WBC 22.1 (H) 06/28/2016   HGB 9.9 (L) 06/28/2016   HCT 29.1 (L) 06/28/2016   MCV 86.1 06/28/2016   PLT 256 06/28/2016   CMP Latest Ref Rng & Units 05/01/2016  Glucose 65 - 99 mg/dL 88  BUN 6 - 20 mg/dL 6  Creatinine 4.69 - 6.29 mg/dL 5.28(U)  Sodium 132 - 440 mmol/L 132(L)  Potassium 3.5 - 5.1 mmol/L 3.0(L)  Chloride 101 - 111 mmol/L 101  CO2 22 - 32 mmol/L 24  Calcium 8.9 - 10.3 mg/dL 1.0(U)  Total Protein 6.5 - 8.1 g/dL 6.0(L)  Total Bilirubin 0.3 - 1.2 mg/dL 0.8  Alkaline Phos 38 - 126 U/L 86  AST 15 - 41 U/L 67(H)  ALT 14 - 54 U/L 83(H)    Discharge instruction: per After Visit Summary and "Baby and Me Booklet".  After visit meds: PLEASE SEE EMR FOR MED LIST AND DISCHARGE MEDICATIONS.   Diet: routine diet  Activity: Advance as tolerated. Pelvic rest for 6 weeks.   Outpatient follow up:6 weeks Follow up Appt:No future appointments. Follow up Visit:No Follow-up on file.  Postpartum contraception: Nexplanon  Newborn Data: Live born female  Birth Weight: 6 lb 1.4 oz (2760 g) APGAR: 8, 9  Baby Feeding: Breast Disposition:home with mother   06/30/2016 Jen Mow, DO

## 2016-06-30 NOTE — Procedures (Signed)
PRE-OP DIAGNOSIS: desired long-term, reversible contraception   POST-OP DIAGNOSIS: Same   PROCEDURE: Nexplanon  placement  Performing Physician: Jen Mow, DO   PROCEDURE:  Pregnancy test: patient post-partum day #2 SVD Written informed consent obtained Site (check): left arm        Sterile Preparation:    [X]       Betadine        [_]     Chloraprep             After a time-out, insertion site was selected 6 - 10 cm from medial epicondyle and marked. Procedure area was prepped and draped in a sterile fashion. 3 mL of 1% lidocaine without epinephrine was used for subcutaneous anesthesia. Anesthesia confirmed.  Nexplanon  trocar was inserted subcutaneously and then Nexplanon  capsule delivered subcutaneously. Trocar was removed from the insertion site. Nexplanon  capsule was palpated by provider and patient to assure satisfactory placement.  Estimated blood loss: minimal Dressings applied: pressure dressing Followup: The patient tolerated the procedure well without complications.  Standard post-procedure care wass explained and return precautions were given.

## 2016-06-30 NOTE — Lactation Note (Signed)
This note was copied from a baby's chart. Lactation Consultation Note  Baby latched in side lying position upon entering. Mother is uncomfortable and states it pinches/feels like biting while baby breastfeeds. Demonstrated how to perform chin tug.  Oral assessment indicated good tongue movement. Encouraged depth and gave mother comfort gels.  Suggest she start alternating w/ shells she has had home. Reviewed engorgement care and monitoring voids/stools. Suggest mother call for OP appt if discomfort continues after discharge.  Patient Name: Ashley Harvey [id number] Date: 06/30/2016 Reason for consult: Follow-up assessment   Maternal Data Has patient been taught Hand Expression?: Yes Does the patient have breastfeeding experience prior to this delivery?: No  Feeding Feeding Type: Breast Fed Length of feed: 20 min  LATCH Score/Interventions Latch: Grasps breast easily, tongue down, lips flanged, rhythmical sucking. Intervention(s): Skin to skin Intervention(s): Breast massage  Audible Swallowing: A few with stimulation Intervention(s): Skin to skin Intervention(s): Skin to skin  Type of Nipple: Everted at rest and after stimulation Intervention(s): Shells  Comfort (Breast/Nipple): Filling, red/small blisters or bruises, mild/mod discomfort  Problem noted: Mild/Moderate discomfort Interventions (Mild/moderate discomfort): Comfort gels;Hand expression  Hold (Positioning): No assistance needed to correctly position infant at breast.  LATCH Score: 8  Lactation Tools Discussed/Used     Consult Status Consult Status: Complete    Hardie Pulley 06/30/2016, 11:44 AM

## 2016-06-30 NOTE — Clinical Social Work Maternal (Signed)
CLINICAL SOCIAL WORK MATERNAL/CHILD NOTE  Patient Details  Name: Ashley Harvey MRN: [id number] Date of Birth: 1996-06-13  Date:  06/30/2016  Clinical Social Worker Initiating Note:  Laurey Arrow Date/ Time Initiated:  06/29/16/1600     Child's Name:  Ashley Harvey   Legal Guardian:  Mother   Need for Interpreter:  None   Date of Referral:  06/28/16     Reason for Referral:  Late or No Prenatal Care , Current Substance Use/Substance Use During Pregnancy , Behavioral Health Issues, including SI    Referral Source:      Address:  [address] [place] [address]  Phone number:  [phone]   Household Members:  Self, Parents, Siblings   Natural Supports (not living in the home):  Spouse/significant other, Parent, Immediate Family, Other (Comment) (FOB's Family)   Professional Supports:  (MOB declined referrals)   Employment:     Type of Work:     Education:  9 to 11 years   Museum/gallery curator Resources:  Medicaid   Other Resources:   (MOB will make appointment with [facility] after hospital D/C)   Cultural/Religious Considerations Which May Impact Care:  none reported Strengths:  Ability to meet basic needs , Home prepared for child , Pediatrician chosen    Risk Factors/Current Problems:  Mental Health Concerns , Substance Use  (Limited PNC)   Cognitive State:  Alert , Linear Thinking    Mood/Affect:  Fearful , Interested , Relaxed , Calm    CSW Assessment: CSW met with MOB to complete an assessment for a consult for [facility], substance use in pregnancy, and mental health concerns.  MOB was inviting and receptive to meeting with CSW.  MOB introduced her room guest as FOB Dorothea Ogle Karen Kitchens).  MOB gave CSW permission to meet with MOB while FOB was present.   CSW inquired about MOB's limited PNC and MOB communicated that she could not afford the co-pay at her OBGYN practice.  MOB expressed she applied for Medicaid however, it took a while before she was  approved. CSW inquired about barriers for MOB and infant regarding upcoming medical appointments.  MOB stated there are no barriers for MOB or infant to make postnatal medical appointments (MOB has Medicaid and will apply for Medicaid for infant).   CSW inquired about MOB's MH hx.  MOB acknowledged her previous SI attempts and reported that overall, she feels better.  MOB denied SI and HI, and informed CSW that she has a safety plan if MOB starts to feel depressed.  MOB reported MOB is currently not taking any meds for her MH, and feels like her depression symptoms has subsided. CSW offered MOB resources and referrals for MH interventions and MOB declined the information. CSW educated MOB about PPD. CSW reviewed signs and symptoms for PPD and encouraged MOB to ask questions. CSW informed MOB of possible supports and interventions to decrease PPD.  CSW also encouraged MOB to seek medical attention if needed for increased signs and symptoms for PPD.  CSW inquired about MOB substance use in pregnancy.  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  CSW informed MOB of a positive UDS for MOB in May for Opiates.  MOB denied utilizing any Opiates and was adamant that the screen was incorrect.  FOB also reported that MOB has never utilized Opiates during pregnancy.  Both parents appeared puzzled and was in disbelief that MOB had a positive Opiate screen in May. CSW offered MOB SA counseling and support  groupS information and MOB declined all resources.  MOB stated "I am not an addict, I just use recreational." CSW informed MOB of the hospital's drug screen policy, and informed MOB and FOB of the 2 screenings for the infant.  MOB was understanding and denied utilizing any substance with the exception of marijuana.   CSW shared the infant's negative UDS and communicated to MOB that CSW will monitor infant's cord.  CSW also informed MOB and FOB that CSW would make a report to [facility] CPS due to [facility]  consistently positive drug screens throughout pregnancy. MOB was concerned, and expressed she did not want her infant "taking away."    CSW educated MOB and FOB about SIDS and Safe Sleep.  MOB appeared knowledgeable as evidence by the responses to CSW questions. FOB was engaged with CSW while discussing SIDS.  FOB asked and answered questions appropriately. MOB communicated she has a crib and co-sleeper for the infant, and feels prepared to take her baby home.  CSW thanked MOB and FOB for meeting with CSW.  Parents had no additional questions or concerns.     CSW Plan/Description:  Patient/Family Education , No Further Intervention Required/No Barriers to Discharge, Information/Referral to [employer] , Psychosocial Support and Ongoing Assessment of Needs, Child Protective Service Report    Laurey Arrow, MSW, LCSW Clinical Social Work [phone]   Dimple Nanas, LCSW 06/30/2016, 9:16 AM

## 2016-07-16 ENCOUNTER — Encounter: Payer: Self-pay | Admitting: *Deleted

## 2016-08-25 ENCOUNTER — Ambulatory Visit: Payer: BC Managed Care – PPO | Admitting: Family

## 2016-08-25 ENCOUNTER — Encounter: Payer: Self-pay | Admitting: *Deleted

## 2016-08-25 NOTE — Progress Notes (Signed)
Ashley Harvey did not keep a scheduled appointment for a postpartum visit. Per review was normal pregnancy with nexplanon inserted in hospital pp day 2. Per discussion with provider-No need to call patient to reschedule. May be rescheduled if she calls.

## 2017-02-04 IMAGING — US US MFM OB COMP +14 WKS
1 series · 14 of 28 positions shown · non-contrast
Comparison: none

[Series 1: us mfm ob comp +14 wks · 66 acquisitions, 14 frames shown]
[im 3/66]
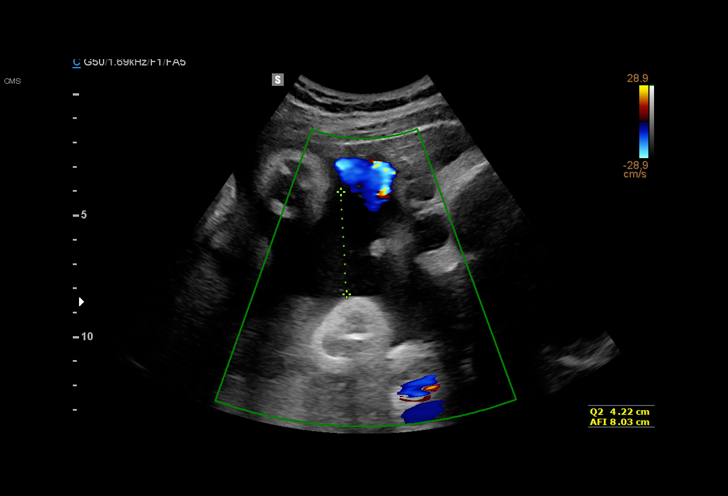
[im 8/66]
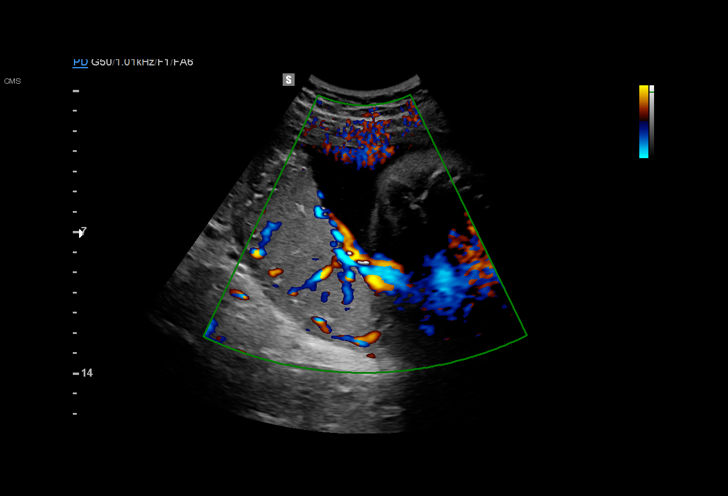
[im 13/66]
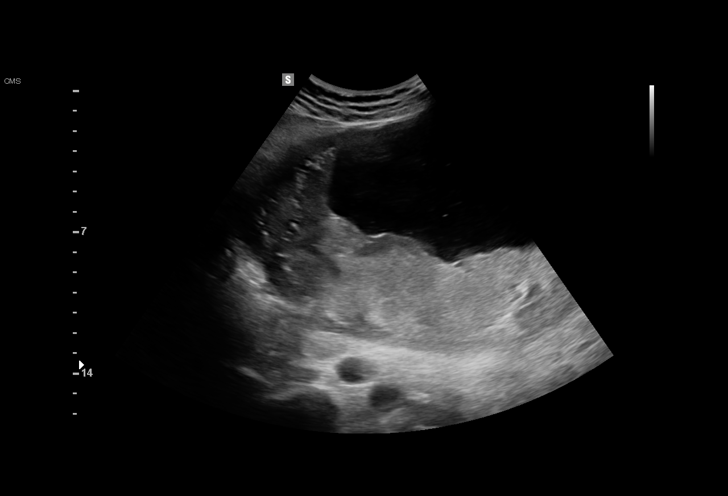
[im 17/66]
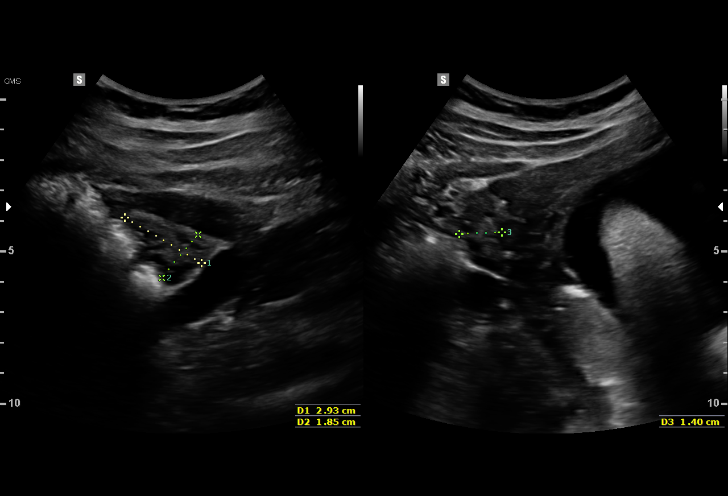
[im 22/66]
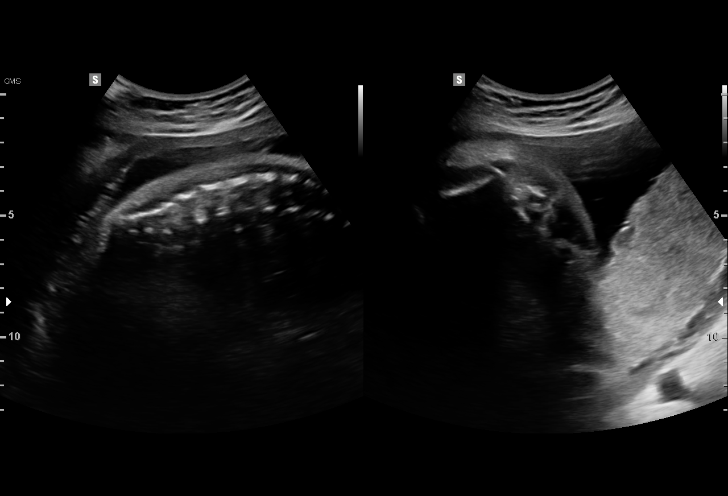
[im 27/66]
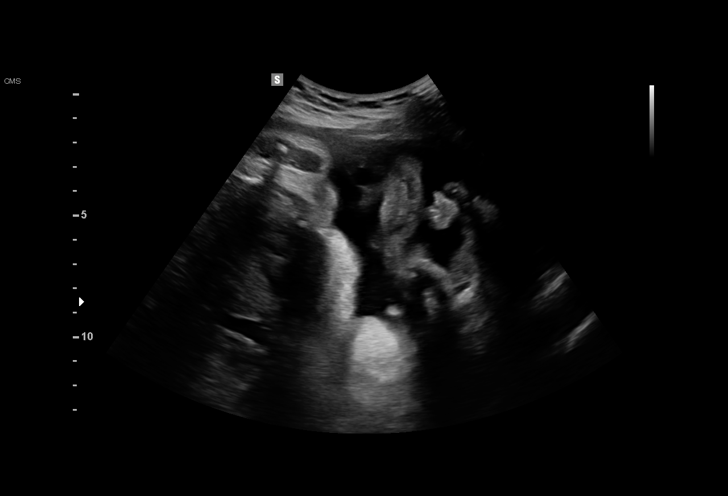
[im 32/66]
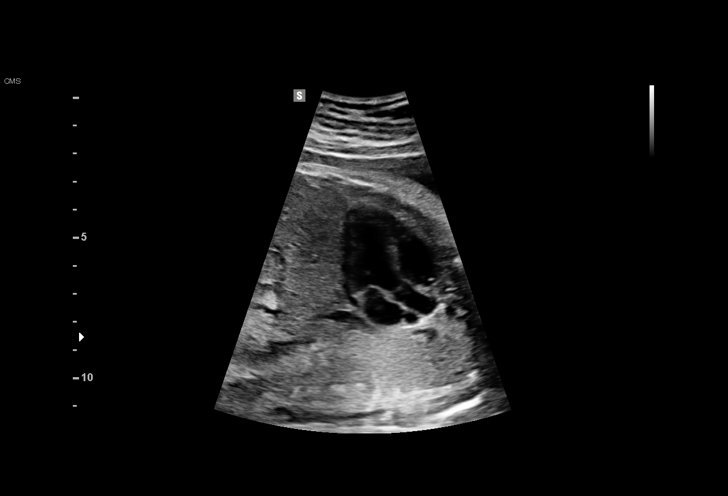
[im 37/66]
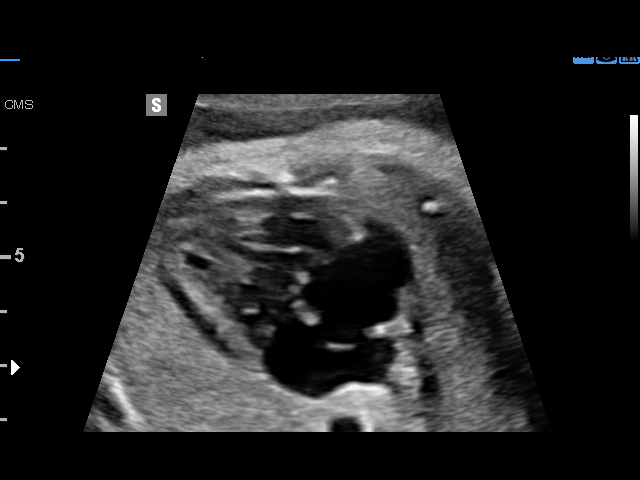
[im 41/66]
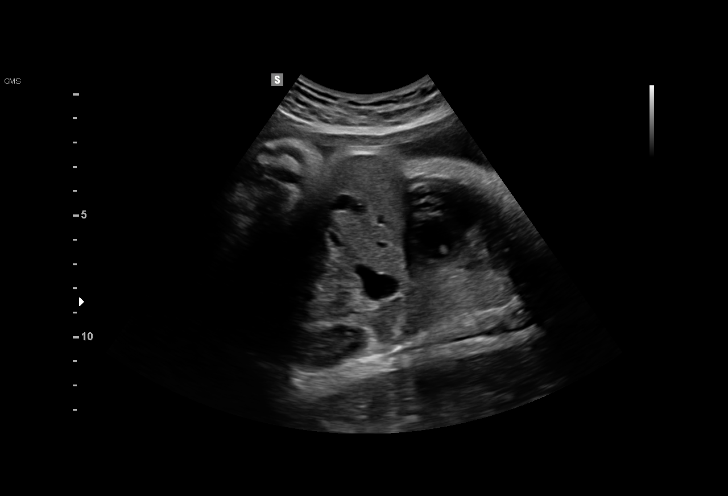
[im 46/66]
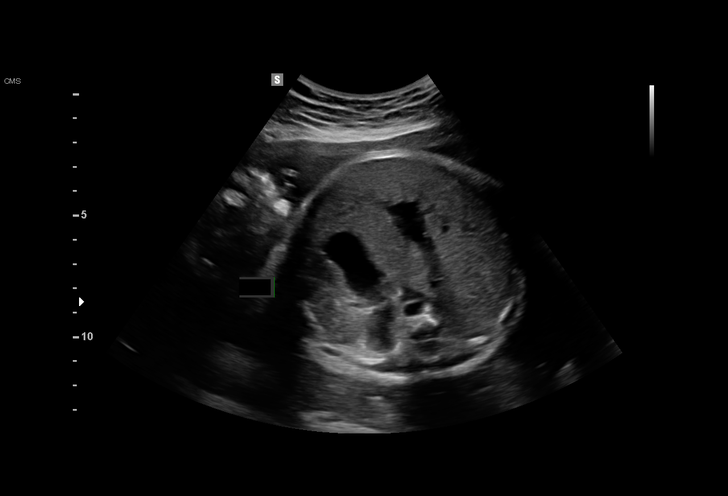
[im 51/66]
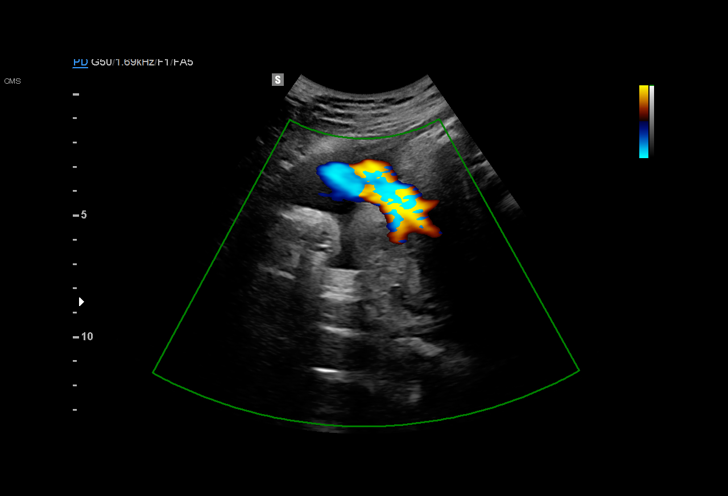
[im 56/66]
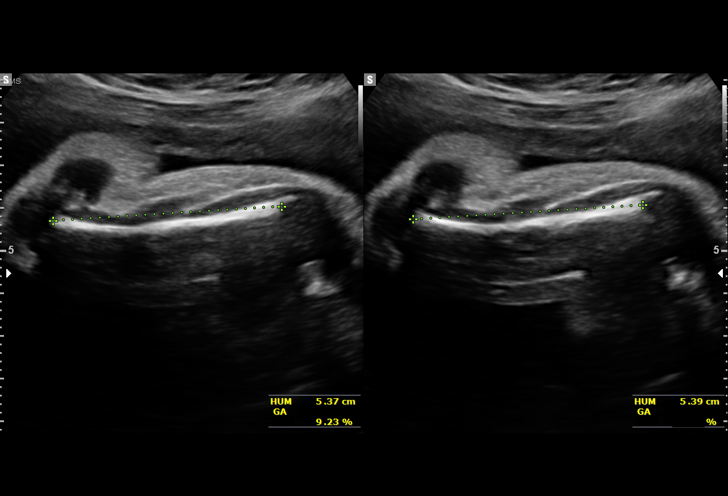
[im 61/66]
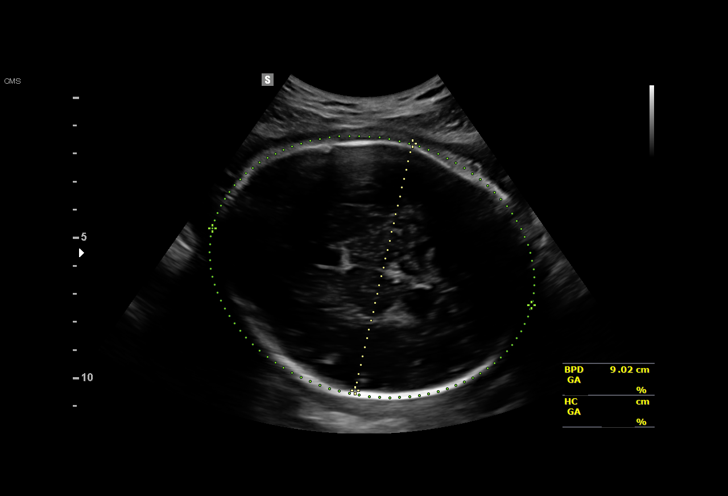
[im 66/66]
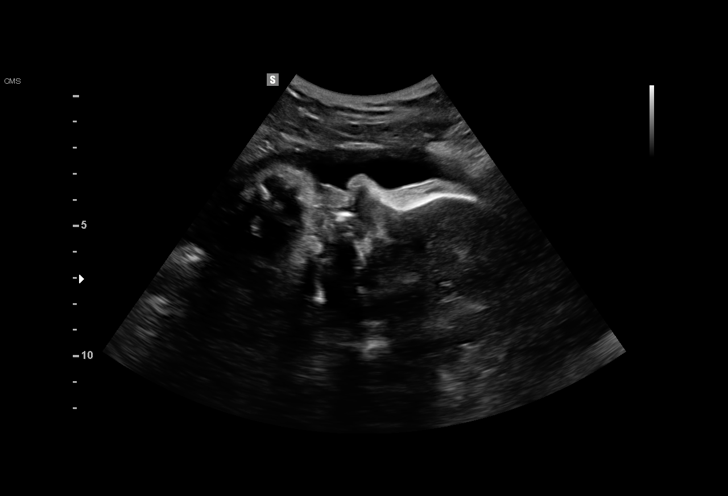

[14 of 28 positions shown; findings below may reference images not displayed]

Hospital Clinic-
Faculty Physician
OB/Gyn Clinic
[REDACTED]

1  CARLOS R CAAL             [id number]      [id number]     [id number]
Indications

34 weeks gestation of pregnancy
Basic anatomic survey                          Z36
Insufficient Prenatal Care
OB History

Gravidity:    1         Term:   0        Prem:   0        SAB:   0
TOP:          0       Ectopic:  0        Living: 0
Fetal Evaluation

Num Of Fetuses:     1
Fetal Heart         145
Rate(bpm):
Cardiac Activity:   Observed
Presentation:       Cephalic
Placenta:           Posterior, above cervical os
P. Cord Insertion:  Visualized, central

Amniotic Fluid
AFI FV:      Subjectively within normal limits

AFI Sum(cm)     %Tile       Largest Pocket(cm)
16.88           62
RUQ(cm)       RLQ(cm)       LUQ(cm)        LLQ(cm)
3.82
Biometry

BPD:      90.3  mm     G. Age:  36w 4d         96  %    CI:         75.8   %   70 - 86
FL/HC:      18.9   %   19.4 -
HC:      328.8  mm     G. Age:  37w 3d         87  %    HC/AC:      1.02       0.96 -
AC:      323.9  mm     G. Age:  36w 2d         94  %    FL/BPD:     68.9   %   71 - 87
FL:       62.2  mm     G. Age:  32w 1d          5  %    FL/AC:      19.2   %   20 - 24
HUM:      53.8  mm     G. Age:  31w 2d        < 5  %

Est. FW:    2774  gm    5 lb 14 oz      79  %
Gestational Age

U/S Today:     35w 4d                                        EDD:   07/08/16
Best:          34w 2d    Det. By:   Previous Ultrasound      EDD:   07/17/16
Anatomy

Cranium:               Appears normal         Aortic Arch:            Appears normal
Cavum:                 Appears normal         Ductal Arch:            Appears normal
Ventricles:            Appears normal         Diaphragm:              Appears normal
Choroid Plexus:        Appears normal         Stomach:                Appears normal, left
sided
Cerebellum:            Appears normal         Abdomen:                Appears normal
Posterior Fossa:       Appears normal         Abdominal Wall:         Not well visualized
Nuchal Fold:           Not applicable (>20    Cord Vessels:           Appears normal (3
wks GA)                                        vessel cord)
Face:                  Appears normal         Kidneys:                Appear normal
(orbits and profile)
Lips:                  Appears normal         Bladder:                Appears normal
Thoracic:              Appears normal         Spine:                  Appears normal
Heart:                 Appears normal         Upper Extremities:      Appears normal
(4CH, axis, and
situs)
RVOT:                  Appears normal         Lower Extremities:      Appears normal
LVOT:                  Appears normal

Other:  Gender not well visualized. Technically difficult due to advanced
gestational age.
Cervix Uterus Adnexa

Cervix
Not visualized (advanced GA >24wks)

Uterus
No abnormality visualized.

Left Ovary
Within normal limits.

Right Ovary
Within normal limits.

Cul De Sac:   No free fluid seen.

Adnexa:       No abnormality visualized.
Impression

Single IUP at 34w 2d
Limited views of the abdominal cord insertion obtained
The remainder of the fetal anatomy appears normal
The estimated fetal weight is at the 79th %tile
Posterior placenta without previa
Normal amniotic fluid volume
Recommendations

Follow-up ultrasounds as clinically indicated.

## 2018-01-31 ENCOUNTER — Ambulatory Visit (INDEPENDENT_AMBULATORY_CARE_PROVIDER_SITE_OTHER): Payer: BC Managed Care – PPO | Admitting: Obstetrics and Gynecology

## 2018-01-31 ENCOUNTER — Encounter: Payer: Self-pay | Admitting: Obstetrics and Gynecology

## 2018-01-31 DIAGNOSIS — Z3046 Encounter for surveillance of implantable subdermal contraceptive: Secondary | ICD-10-CM | POA: Diagnosis not present

## 2018-01-31 DIAGNOSIS — Z30011 Encounter for initial prescription of contraceptive pills: Secondary | ICD-10-CM

## 2018-01-31 DIAGNOSIS — Z309 Encounter for contraceptive management, unspecified: Secondary | ICD-10-CM | POA: Insufficient documentation

## 2018-01-31 DIAGNOSIS — Z7689 Persons encountering health services in other specified circumstances: Secondary | ICD-10-CM | POA: Insufficient documentation

## 2018-01-31 MED ORDER — DESOGESTREL-ETHINYL ESTRADIOL 0.15-30 MG-MCG PO TABS: 1.0000 | ORAL_TABLET | Freq: Every day | ORAL | 11 refills | Status: DC

## 2018-01-31 NOTE — Progress Notes (Signed)
Pt states has been on period off & on for 2 years, some times it spots but never stops completely.

## 2018-01-31 NOTE — Progress Notes (Signed)
Patient ID: Ashley Harvey, female   DOB: 07/03/1996, 22 y.o.   MRN: [id number] Ashley SillLinsey Perrault is a 22 y.o. G1P1001 here for Nexplanon removal.   Nexplanon Removal Patient identified, informed consent performed, consent signed.   Appropriate time out taken. Nexplanon site identified.  Area prepped in usual sterile fashon. One ml of 1% lidocaine was used to anesthetize the area at the distal end of the implant. A small stab incision was made right beside the implant on the distal portion.  The Nexplanon rod was grasped using hemostats and removed without difficulty.  There was minimal blood loss. There were no complications.  3 ml of 1% lidocaine was injected around the incision for post-procedure analgesia.  Steri-strips were applied over the small incision.  A pressure bandage was applied to reduce any bruising.  The patient tolerated the procedure well and was given post procedure instructions.  Patient is planning to use OCP's for contraception/attempt conception.   Hermina StaggersMichael L Evangelina Delancey, MD, FACOG Attending Obstetrician & Gynecologist Center for [facility], [facility]

## 2018-01-31 NOTE — Patient Instructions (Signed)
Oral Contraception Information Oral contraceptive pills (OCPs) are medicines taken to prevent pregnancy. OCPs work by preventing the ovaries from releasing eggs. The hormones in OCPs also cause the cervical mucus to thicken, preventing the sperm from entering the uterus. The hormones also cause the uterine lining to become thin, not allowing a fertilized egg to attach to the inside of the uterus. OCPs are highly effective when taken exactly as prescribed. However, OCPs do not prevent sexually transmitted diseases (STDs). Safe sex practices, such as using condoms along with the pill, can help prevent STDs. Before taking the pill, you may have a physical exam and Pap test. Your health care provider may order blood tests. The health care provider will make sure you are a good candidate for oral contraception. Discuss with your health care provider the possible side effects of the OCP you may be prescribed. When starting an OCP, it can take 2 to 3 months for the body to adjust to the changes in hormone levels in your body. Types of oral contraception  The combination pill-This pill contains estrogen and progestin (synthetic progesterone) hormones. The combination pill comes in 21-day, 28-day, or 91-day packs. Some types of combination pills are meant to be taken continuously (365-day pills). With 21-day packs, you do not take pills for 7 days after the last pill. With 28-day packs, the pill is taken every day. The last 7 pills are without hormones. Certain types of pills have more than 21 hormone-containing pills. With 91-day packs, the first 84 pills contain both hormones, and the last 7 pills contain no hormones or contain estrogen only.  The minipill-This pill contains the progesterone hormone only. The pill is taken every day continuously. It is very important to take the pill at the same time each day. The minipill comes in packs of 28 pills. All 28 pills contain the hormone. Advantages of oral  contraceptive pills  Decreases premenstrual symptoms.  Treats menstrual period cramps.  Regulates the menstrual cycle.  Decreases a heavy menstrual flow.  May treatacne, depending on the type of pill.  Treats abnormal uterine bleeding.  Treats polycystic ovarian syndrome.  Treats endometriosis.  Can be used as emergency contraception. Things that can make oral contraceptive pills less effective OCPs can be less effective if:  You forget to take the pill at the same time every day.  You have a stomach or intestinal disease that lessens the absorption of the pill.  You take OCPs with other medicines that make OCPs less effective, such as antibiotics, certain HIV medicines, and some seizure medicines.  You take expired OCPs.  You forget to restart the pill on day 7, when using the packs of 21 pills.  Risks associated with oral contraceptive pills Oral contraceptive pills can sometimes cause side effects, such as:  Headache.  Nausea.  Breast tenderness.  Irregular bleeding or spotting.  Combination pills are also associated with a small increased risk of:  Blood clots.  Heart attack.  Stroke.  This information is not intended to replace advice given to you by your health care provider. Make sure you discuss any questions you have with your health care provider. Document Released: 02/05/2003 Document Revised: 04/22/2016 Document Reviewed: 05/06/2013 Elsevier Interactive Patient Education  2018 [employer]vier Inc[employer]  

## 2018-02-03 ENCOUNTER — Encounter: Payer: Self-pay | Admitting: *Deleted

## 2022-02-20 DIAGNOSIS — F419 Anxiety disorder, unspecified: Secondary | ICD-10-CM | POA: Insufficient documentation

## 2022-02-20 DIAGNOSIS — F418 Other specified anxiety disorders: Secondary | ICD-10-CM | POA: Insufficient documentation

## 2022-02-20 DIAGNOSIS — F23 Brief psychotic disorder: Secondary | ICD-10-CM | POA: Insufficient documentation

## 2022-02-20 DIAGNOSIS — F321 Major depressive disorder, single episode, moderate: Secondary | ICD-10-CM | POA: Insufficient documentation

## 2022-03-22 ENCOUNTER — Ambulatory Visit: Payer: BC Managed Care – PPO | Admitting: Obstetrics & Gynecology

## 2022-11-28 ENCOUNTER — Encounter: Payer: Self-pay | Admitting: Emergency Medicine

## 2022-11-28 ENCOUNTER — Other Ambulatory Visit: Payer: Self-pay

## 2022-11-28 ENCOUNTER — Ambulatory Visit
Admission: EM | Admit: 2022-11-28 | Discharge: 2022-11-28 | Disposition: A | Discharge status: Home / Self Care | Payer: Medicaid Other | Attending: Physician Assistant | Admitting: Physician Assistant

## 2022-11-28 DIAGNOSIS — J039 Acute tonsillitis, unspecified: Secondary | ICD-10-CM | POA: Diagnosis not present

## 2022-11-28 DIAGNOSIS — J020 Streptococcal pharyngitis: Secondary | ICD-10-CM | POA: Diagnosis not present

## 2022-11-28 DIAGNOSIS — J029 Acute pharyngitis, unspecified: Secondary | ICD-10-CM

## 2022-11-28 DIAGNOSIS — H66001 Acute suppurative otitis media without spontaneous rupture of ear drum, right ear: Secondary | ICD-10-CM | POA: Diagnosis not present

## 2022-11-28 LAB — POCT RAPID STREP A (OFFICE): Rapid Strep A Screen: POSITIVE — AB

## 2022-11-28 LAB — POCT MONO SCREEN (KUC): Mono, POC: NEGATIVE

## 2022-11-28 MED ORDER — AMOXICILLIN-POT CLAVULANATE 875-125 MG PO TABS: 1.0000 | ORAL_TABLET | Freq: Two times a day (BID) | ORAL | 0 refills | Status: DC

## 2022-11-28 NOTE — Discharge Instructions (Signed)
You are positive for strep.  Please start Augmentin twice daily as prescribed to cover for strep and ear infection.  Gargle with warm salt water and use Tylenol/ibuprofen for pain relief.  You are contagious for 24 hours after starting medication.  Throw your toothbrush a few days after starting medicine to prevent reinfection.  If you are taking any birth control antibiotics can decrease the effectiveness of this so please use backup birth control.  If you have any swelling of your throat, shortness of breath, fever you need to be seen immediately.

## 2022-11-28 NOTE — ED Triage Notes (Signed)
Patient presents to Urgent Care with complaints of sore throat, swollen tonsils, dysphagia since 5 days ago. Patient reports the sore throat started first then the earache started about 2 days ago. Denies any fever or chills. Took Ibuprofen for pain. Exposed to niece and nephew who had strep throat.

## 2022-11-28 NOTE — ED Provider Notes (Signed)
[facility]    CSN: [id number] Arrival date & time: 11/28/22  1203      History   Chief Complaint Chief Complaint  Patient presents with   Sore Throat   Otalgia    Right    HPI Ashley Harvey is a 26 y.o. female.   Patient presents today with a 4 to 5-day history of sore throat.  Reports pain is rated 10 on a 10 pain scale, described as sharp, worse with swallowing, no alleviating factors identified.  She reports radiation of pain to her right ear.  Denies any recent swimming or airplane travel.  Denies any otorrhea, fever, significant cough, shortness of breath, nausea, vomiting.  She has tried over-the-counter medications like Tylenol without improvement of symptoms.  Reports exposure to strep.  Denies any recent antibiotics.  Denies any muffled voice, shortness of breath, swelling of her throat.  She is confident that she is not pregnant.    Past Medical History:  Diagnosis Date   Seizures (HCC)    [redacted] weeks pregnant; stress induced, most recent one @ 28 wks    Patient Active Problem List   Diagnosis Date Noted   Contraception management 01/31/2018   Implanon removal 01/31/2018   Protein-calorie malnutrition, severe 12/09/2015    Past Surgical History:  Procedure Laterality Date   NO PAST SURGERIES      OB History     Gravida  1   Para  1   Term  1   Preterm      AB      Living  1      SAB      IAB      Ectopic      Multiple  0   Live Births  1            Home Medications    Prior to Admission medications   Medication Sig Start Date End Date Taking? Authorizing Provider  amoxicillin-clavulanate (AUGMENTIN) 875-125 MG tablet Take 1 tablet by mouth every 12 (twelve) hours. 11/28/22  Yes Kloie Whiting, Noberto Retort, PA-C  acetaminophen (TYLENOL) 325 MG tablet Take 2 tablets (650 mg total) by mouth every 4 (four) hours as needed (for pain scale < 4). 06/30/16   Mumaw, Hiram Comber, DO  desogestrel-ethinyl estradiol  (APRI,EMOQUETTE,SOLIA) 0.15-30 MG-MCG tablet Take 1 tablet by mouth daily. 01/31/18 01/31/19  Hermina Staggers, MD    Family History Family History  Problem Relation Age of Onset   Cancer Mother    Hyperlipidemia Father    Hypertension Father    Diabetes Father    Cancer Maternal Aunt    Cancer Maternal Uncle    Cancer Maternal Grandmother    Cancer Maternal Grandfather    Diabetes Paternal Grandmother    Hypertension Paternal Grandmother    Hypertension Paternal Grandfather     Social History Social History   Tobacco Use   Smoking status: Former    Types: Cigarettes    Quit date: 10/30/2015    Years since quitting: 7.0   Smokeless tobacco: Never  Substance Use Topics   Alcohol use: No   Drug use: Yes    Frequency: 7.0 times per week    Types: Marijuana    Comment: last use @ 6wks preg     Allergies   Vancomycin   Review of Systems Review of Systems  Constitutional:  Positive for activity change. Negative for appetite change, fatigue and fever.  HENT:  Positive for congestion, ear pain, sore throat  and trouble swallowing. Negative for sinus pressure, sneezing and voice change.   Respiratory:  Negative for cough and shortness of breath.   Cardiovascular:  Negative for chest pain.  Gastrointestinal:  Negative for abdominal pain, diarrhea, nausea and vomiting.  Neurological:  Negative for dizziness, light-headedness and headaches.     Physical Exam Triage Vital Signs ED Triage Vitals  Enc Vitals Group     BP 11/28/22 1224 116/78     Pulse Rate 11/28/22 1224 92     Resp 11/28/22 1224 18     Temp 11/28/22 1224 98 F (36.7 C)     Temp Source 11/28/22 1224 Oral     SpO2 11/28/22 1224 96 %     Weight --      Height --      Head Circumference --      Peak Flow --      Pain Score 11/28/22 1222 10     Pain Loc --      Pain Edu? --      Excl. in GC? --    No data found.  Updated Vital Signs BP 116/78 (BP Location: Left Arm)   Pulse 92   Temp 98 F (36.7 C)  (Oral)   Resp 18   SpO2 96%   Visual Acuity Right Eye Distance:   Left Eye Distance:   Bilateral Distance:    Right Eye Near:   Left Eye Near:    Bilateral Near:     Physical Exam Vitals reviewed.  Constitutional:      General: She is awake. She is not in acute distress.    Appearance: Normal appearance. She is well-developed. She is not ill-appearing.     Comments: Very pleasant female appears stated age in no acute distress sitting comfortably in exam room  HENT:     Head: Normocephalic and atraumatic.     Right Ear: Ear canal and external ear normal. Tympanic membrane is erythematous and retracted. Tympanic membrane is not bulging.     Left Ear: Tympanic membrane, ear canal and external ear normal. Tympanic membrane is not erythematous or bulging.     Nose:     Right Sinus: No maxillary sinus tenderness or frontal sinus tenderness.     Left Sinus: No maxillary sinus tenderness or frontal sinus tenderness.     Mouth/Throat:     Pharynx: Uvula midline. Posterior oropharyngeal erythema present. No oropharyngeal exudate.     Tonsils: Tonsillar exudate present. No tonsillar abscesses. 1+ on the right. 1+ on the left.  Cardiovascular:     Rate and Rhythm: Normal rate and regular rhythm.     Heart sounds: Normal heart sounds, S1 normal and S2 normal. No murmur heard. Pulmonary:     Effort: Pulmonary effort is normal.     Breath sounds: Normal breath sounds. No wheezing, rhonchi or rales.     Comments: Clear to auscultation bilaterally Lymphadenopathy:     Head:     Right side of head: No submental, submandibular or tonsillar adenopathy.     Left side of head: No submental, submandibular or tonsillar adenopathy.     Cervical: No cervical adenopathy.  Psychiatric:        Behavior: Behavior is cooperative.      UC Treatments / Results  Labs (all labs ordered are listed, but only abnormal results are displayed) Labs Reviewed  POCT RAPID STREP A (OFFICE) - Abnormal; Notable  for the following components:      Result Value  Rapid Strep A Screen Positive (*)    All other components within normal limits  POCT MONO SCREEN [facility])    EKG   Radiology No results found.  Procedures Procedures (including critical care time)  Medications Ordered in UC Medications - No data to display  Initial Impression / Assessment and Plan / UC Course  I have reviewed the triage vital signs and the nursing notes.  Pertinent labs & imaging results that were available during my care of the patient were reviewed by me and considered in my medical decision making (see chart for details).     Patient is well-appearing, afebrile, nontoxic, nontachycardic.  She tested positive for strep and was negative for mono.  Will treat with Augmentin given associated ear infection for better coverage.  Discussed that she should dispose of her toothbrush a few days after starting medication to prevent reinfection.  She is to gargle with warm salt water and use Tylenol and ibuprofen for pain.  Discussed that she is contagious for 24 hours after starting medication.  Discussed that antibiotics can decrease the effectiveness of birth control and she is to use backup birth control while on this medication.  Discussed that if her symptoms are not improving or anything worsens and she develops high fever, shortness of breath, swelling of her throat, muffled voice, dysphagia she needs to be seen immediately.  Strict return precautions given.  Work excuse note provided.  Final Clinical Impressions(s) / UC Diagnoses   Final diagnoses:  Exudative tonsillitis  Sore throat  Non-recurrent acute suppurative otitis media of right ear without spontaneous rupture of tympanic membrane  Strep pharyngitis     Discharge Instructions      You are positive for strep.  Please start Augmentin twice daily as prescribed to cover for strep and ear infection.  Gargle with warm salt water and use Tylenol/ibuprofen for  pain relief.  You are contagious for 24 hours after starting medication.  Throw your toothbrush a few days after starting medicine to prevent reinfection.  If you are taking any birth control antibiotics can decrease the effectiveness of this so please use backup birth control.  If you have any swelling of your throat, shortness of breath, fever you need to be seen immediately.     ED Prescriptions     Medication Sig Dispense Auth. Provider   amoxicillin-clavulanate (AUGMENTIN) 875-125 MG tablet Take 1 tablet by mouth every 12 (twelve) hours. 14 tablet Chloee Tena, Noberto Retort, PA-C      PDMP not reviewed this encounter.   Jeani Hawking, PA-C 11/28/22 1311

## 2022-12-03 ENCOUNTER — Encounter: Payer: Self-pay | Admitting: Emergency Medicine

## 2022-12-03 ENCOUNTER — Ambulatory Visit
Admission: EM | Admit: 2022-12-03 | Discharge: 2022-12-03 | Disposition: A | Discharge status: Home / Self Care | Payer: Medicaid Other | Attending: Family Medicine | Admitting: Family Medicine

## 2022-12-03 DIAGNOSIS — H6691 Otitis media, unspecified, right ear: Secondary | ICD-10-CM

## 2022-12-03 MED ORDER — PREDNISONE 20 MG PO TABS: ORAL_TABLET | ORAL | 0 refills | Status: DC

## 2022-12-03 MED ORDER — CEFDINIR 300 MG PO CAPS: 300.0000 mg | ORAL_CAPSULE | Freq: Two times a day (BID) | ORAL | 0 refills | Status: DC

## 2022-12-03 NOTE — Discharge Instructions (Signed)
May take Pseudoephedrine (30mg , one or two every 4 to 6 hours) for sinus congestion. Take plenty of fluids. May use Afrin nasal spray (or generic oxymetazoline) each morning for about 5 days and then discontinue.  Also recommend using saline nasal spray several times daily and saline nasal irrigation (AYR is a common brand).  Use Flonase nasal spray each morning after using Afrin nasal spray and saline nasal irrigation. May take Tylenol as needed for pain.

## 2022-12-03 NOTE — ED Triage Notes (Addendum)
Right ear pain sine last visit on 11/28/22 Pain is 10/10 - ibuprofen 800 mg at 0900 Taking antibiotic from last visit (strep) Here w/ her daughter Pt noted yellow drainage from right ear x 5  days

## 2022-12-03 NOTE — ED Provider Notes (Signed)
[facility]    CSN: [id number] Arrival date & time: 12/03/22  1025      History   Chief Complaint Chief Complaint  Patient presents with   Otalgia    right    HPI Ashley Harvey is a 27 y.o. female.   Patient was evaluated in this clinic five days ago for strep pharyngitis and right otitis media, treated with Augmentin.  Today she reports that her sore throat has completely cleared, but she still has right earache and nasal congestion.  She denies fever and feels well otherwise.  The history is provided by the patient.    Past Medical History:  Diagnosis Date   Seizures ([place])    [redacted] weeks pregnant; stress induced, most recent one @ 28 wks    Patient Active Problem List   Diagnosis Date Noted   Contraception management 01/31/2018   Implanon removal 01/31/2018   Protein-calorie malnutrition, severe 12/09/2015    Past Surgical History:  Procedure Laterality Date   NO PAST SURGERIES      OB History     Gravida  1   Para  1   Term  1   Preterm      AB      Living  1      SAB      IAB      Ectopic      Multiple  0   Live Births  1            Home Medications    Prior to Admission medications   Medication Sig Start Date End Date Taking? Authorizing Provider  cefdinir (OMNICEF) 300 MG capsule Take 1 capsule (300 mg total) by mouth 2 (two) times daily. 12/03/22  Yes Kandra Nicolas, MD  predniSONE (DELTASONE) 20 MG tablet Take one tab by mouth twice daily for 4 days, then one daily for 3 days. Take with food. 12/03/22  Yes Kandra Nicolas, MD  acetaminophen (TYLENOL) 325 MG tablet Take 2 tablets (650 mg total) by mouth every 4 (four) hours as needed (for pain scale < 4). Patient not taking: Reported on 12/03/2022 06/30/16   Isaias Sakai, DO  desogestrel-ethinyl estradiol (APRI,EMOQUETTE,SOLIA) 0.15-30 MG-MCG tablet Take 1 tablet by mouth daily. Patient not taking: Reported on 12/03/2022 01/31/18 01/31/19  Chancy Milroy, MD     Family History Family History  Problem Relation Age of Onset   Cancer Mother    Hyperlipidemia Father    Hypertension Father    Diabetes Father    Cancer Maternal Grandmother    Cancer Maternal Grandfather    Diabetes Paternal Grandmother    Hypertension Paternal Grandmother    Hypertension Paternal Grandfather    Cancer Maternal Aunt    Cancer Maternal Uncle     Social History Social History   Tobacco Use   Smoking status: Every Day    Types: Cigarettes, E-cigarettes    Last attempt to quit: 10/30/2015    Years since quitting: 7.0   Smokeless tobacco: Never  Vaping Use   Vaping Use: Every day   Substances: Nicotine, Flavoring  Substance Use Topics   Alcohol use: No   Drug use: Yes    Frequency: 7.0 times per week    Types: Marijuana    Comment: last use @ 6wks preg     Allergies   Vancomycin   Review of Systems Review of Systems No sore throat No cough No pleuritic pain No wheezing + nasal congestion +  post-nasal drainage No sinus pain/pressure No itchy/red eyes + right earache No hemoptysis No SOB No fever/chills No nausea No vomiting No abdominal pain No diarrhea No urinary symptoms No skin rash No fatigue No myalgias No headache   Physical Exam Triage Vital Signs ED Triage Vitals  Enc Vitals Group     BP 12/03/22 1036 116/79     Pulse Rate 12/03/22 1036 88     Resp 12/03/22 1036 16     Temp 12/03/22 1036 98.7 F (37.1 C)     Temp Source 12/03/22 1036 Oral     SpO2 12/03/22 1036 96 %     Weight 12/03/22 1039 230 lb (104.3 kg)     Height 12/03/22 1039 5\' 2"  (1.575 m)     Head Circumference --      Peak Flow --      Pain Score 12/03/22 1037 10     Pain Loc --      Pain Edu? --      Excl. in GC? --    No data found.  Updated Vital Signs BP 116/79 (BP Location: Left Arm)   Pulse 88   Temp 98.7 F (37.1 C) (Oral)   Resp 16   Ht 5\' 2"  (1.575 m)   Wt 104.3 kg   LMP 11/17/2022 (Exact Date)   SpO2 96%   BMI 42.07 kg/m    Visual Acuity Right Eye Distance:   Left Eye Distance:   Bilateral Distance:    Right Eye Near:   Left Eye Near:    Bilateral Near:     Physical Exam Nursing notes and Vital Signs reviewed. Appearance:  Patient appears stated age, and in no acute distress Eyes:  Pupils are equal, round, and reactive to light and accomodation.  Extraocular movement is intact.  Conjunctivae are not inflamed  Ears:  Canals normal.  Right tympanic membrane is erythematous.  Left tympanic membrane is normal. Nose:  Mildly congested turbinates, more pronounced on the right.  No sinus tenderness.   Pharynx:  Normal Neck:  Supple.  No adenopathy. Lungs:  Clear to auscultation.  Breath sounds are equal.  Moving air well. Heart:  Regular rate and rhythm without murmurs, rubs, or gallops.  Abdomen:  Nontender  Extremities:  No edema.  Skin:  No rash present.   UC Treatments / Results  Labs (all labs ordered are listed, but only abnormal results are displayed) Labs Reviewed - No data to display  EKG   Radiology No results found.  Procedures Procedures (including critical care time)  Medications Ordered in UC Medications - No data to display  Initial Impression / Assessment and Plan / UC Course  I have reviewed the triage vital signs and the nursing notes.  Pertinent labs & imaging results that were available during my care of the patient were reviewed by me and considered in my medical decision making (see chart for details).    Suspect right eustachian tube dysfunction contributing to persistent right otitis media. Begin Omnicef for one week. (Stop Augmentin).  Begin prednisone burst/taper. Followup with ENT if not improved one week.  Final Clinical Impressions(s) / UC Diagnoses   Final diagnoses:  Acute right otitis media     Discharge Instructions      May take Pseudoephedrine (30mg , one or two every 4 to 6 hours) for sinus congestion. Take plenty of fluids. May use Afrin  nasal spray (or generic oxymetazoline) each morning for about 5 days and then discontinue.  Also recommend  using saline nasal spray several times daily and saline nasal irrigation (AYR is a common brand).  Use Flonase nasal spray each morning after using Afrin nasal spray and saline nasal irrigation. May take Tylenol as needed for pain.    ED Prescriptions     Medication Sig Dispense Auth. Provider   cefdinir (OMNICEF) 300 MG capsule Take 1 capsule (300 mg total) by mouth 2 (two) times daily. 14 capsule Kandra Nicolas, MD   predniSONE (DELTASONE) 20 MG tablet Take one tab by mouth twice daily for 4 days, then one daily for 3 days. Take with food. 11 tablet Kandra Nicolas, MD         Kandra Nicolas, MD 12/05/22 1030

## 2023-02-08 ENCOUNTER — Ambulatory Visit
Admission: EM | Admit: 2023-02-08 | Discharge: 2023-02-08 | Disposition: A | Discharge status: Home / Self Care | Payer: Medicaid Other | Attending: Family Medicine | Admitting: Family Medicine

## 2023-02-08 DIAGNOSIS — J029 Acute pharyngitis, unspecified: Secondary | ICD-10-CM

## 2023-02-08 DIAGNOSIS — Z20818 Contact with and (suspected) exposure to other bacterial communicable diseases: Secondary | ICD-10-CM | POA: Diagnosis not present

## 2023-02-08 MED ORDER — AMOXICILLIN 875 MG PO TABS: 875.0000 mg | ORAL_TABLET | Freq: Two times a day (BID) | ORAL | 0 refills | Status: AC

## 2023-02-08 NOTE — ED Triage Notes (Addendum)
Pt c/o sore throat, runny nose and cough x 2 days. Denies fever. No OTC meds tried.

## 2023-02-08 NOTE — ED Provider Notes (Signed)
[name]    CSN: [id number] Arrival date & time: 02/08/23  1849      History   Chief Complaint Chief Complaint  Patient presents with   Sore Throat   Cough   Nasal Congestion    HPI Ashley Harvey is a 27 y.o. female.   HPI  Ashley Harvey is here with her daughter.  The daughter just went to her pediatrician.  She was diagnosed with strep throat.  Ashley Harvey also has a severe sore throat.  Mild nasal congestion.  Mild cough.  Past Medical History:  Diagnosis Date   Seizures ([place])    [redacted] weeks pregnant; stress induced, most recent one @ 28 wks    Patient Active Problem List   Diagnosis Date Noted   Contraception management 01/31/2018   Implanon removal 01/31/2018   Protein-calorie malnutrition, severe 12/09/2015    Past Surgical History:  Procedure Laterality Date   NO PAST SURGERIES      OB History     Gravida  1   Para  1   Term  1   Preterm      AB      Living  1      SAB      IAB      Ectopic      Multiple  0   Live Births  1            Home Medications    Prior to Admission medications   Medication Sig Start Date End Date Taking? Authorizing Provider  amoxicillin (AMOXIL) 875 MG tablet Take 1 tablet (875 mg total) by mouth 2 (two) times daily for 10 days. 02/08/23 02/18/23 Yes Raylene Everts, MD    Family History Family History  Problem Relation Age of Onset   Cancer Mother    Hyperlipidemia Father    Hypertension Father    Diabetes Father    Cancer Maternal Grandmother    Cancer Maternal Grandfather    Diabetes Paternal Grandmother    Hypertension Paternal Grandmother    Hypertension Paternal Grandfather    Cancer Maternal Aunt    Cancer Maternal Uncle     Social History Social History   Tobacco Use   Smoking status: Every Day    Types: Cigarettes, E-cigarettes    Last attempt to quit: 10/30/2015    Years since quitting: 7.2   Smokeless tobacco: Never  Vaping Use   Vaping Use: Every day    Substances: Nicotine, Flavoring  Substance Use Topics   Alcohol use: No   Drug use: Yes    Frequency: 7.0 times per week    Types: Marijuana    Comment: last use @ 6wks preg     Allergies   Vancomycin   Review of Systems Review of Systems See HPI  Physical Exam Triage Vital Signs ED Triage Vitals  Enc Vitals Group     BP 02/08/23 1859 122/85     Pulse Rate 02/08/23 1859 87     Resp 02/08/23 1859 17     Temp 02/08/23 1859 97.9 F (36.6 C)     Temp Source 02/08/23 1859 Oral     SpO2 02/08/23 1859 97 %     Weight --      Height --      Head Circumference --      Peak Flow --      Pain Score 02/08/23 1901 7     Pain Loc --      Pain Edu? --  Excl. in GC? --    No data found.  Updated Vital Signs BP 122/85 (BP Location: Left Arm)   Pulse 87   Temp 97.9 F (36.6 C) (Oral)   Resp 17   LMP 01/10/2023 (Approximate)   SpO2 97%       Physical Exam Constitutional:      General: She is not in acute distress.    Appearance: She is well-developed.  HENT:     Head: Normocephalic and atraumatic.     Mouth/Throat:     Pharynx: Uvula midline. Pharyngeal swelling and posterior oropharyngeal erythema present.     Tonsils: No tonsillar exudate. 2+ on the right. 2+ on the left.  Eyes:     Conjunctiva/sclera: Conjunctivae normal.     Pupils: Pupils are equal, round, and reactive to light.  Cardiovascular:     Rate and Rhythm: Normal rate.  Pulmonary:     Effort: Pulmonary effort is normal. No respiratory distress.  Abdominal:     General: There is no distension.     Palpations: Abdomen is soft.  Musculoskeletal:        General: Normal range of motion.     Cervical back: Normal range of motion.  Lymphadenopathy:     Cervical: Cervical adenopathy present.  Skin:    General: Skin is warm and dry.  Neurological:     Mental Status: She is alert.      UC Treatments / Results  Labs (all labs ordered are listed, but only abnormal results are displayed) Labs  Reviewed - No data to display  EKG   Radiology No results found.  Procedures Procedures (including critical care time)  Medications Ordered in UC Medications - No data to display  Initial Impression / Assessment and Plan / UC Course  I have reviewed the triage vital signs and the nursing notes.  Pertinent labs & imaging results that were available during my care of the patient were reviewed by me and considered in my medical decision making (see chart for details).     Patient has an erythematous and swollen throat and the light of direct strep exposure.  I think this treatment with amoxicillin is prudent Final Clinical Impressions(s) / UC Diagnoses   Final diagnoses:  Sore throat  Exposure to strep throat     Discharge Instructions      Drink lots of fluids Take Tylenol or ibuprofen for pain or fever Take antibiotic 2 times a day for 10 full days.  Do not stop early Call for labs   ED Prescriptions     Medication Sig Dispense Auth. Provider   amoxicillin (AMOXIL) 875 MG tablet Take 1 tablet (875 mg total) by mouth 2 (two) times daily for 10 days. 20 tablet Raylene Everts, MD      PDMP not reviewed this encounter.   Raylene Everts, MD 02/08/23 [phone]

## 2023-02-08 NOTE — Discharge Instructions (Signed)
Drink lots of fluids Take Tylenol or ibuprofen for pain or fever Take antibiotic 2 times a day for 10 full days.  Do not stop early Call for labs

## 2023-02-14 ENCOUNTER — Ambulatory Visit
Admission: EM | Admit: 2023-02-14 | Discharge: 2023-02-14 | Disposition: A | Discharge status: Home / Self Care | Payer: Medicaid Other | Attending: Family Medicine | Admitting: Family Medicine

## 2023-02-14 ENCOUNTER — Encounter: Payer: Self-pay | Admitting: Emergency Medicine

## 2023-02-14 DIAGNOSIS — R110 Nausea: Secondary | ICD-10-CM

## 2023-02-14 DIAGNOSIS — J029 Acute pharyngitis, unspecified: Secondary | ICD-10-CM | POA: Diagnosis not present

## 2023-02-14 MED ORDER — CEFTRIAXONE SODIUM 500 MG IJ SOLR
1000.0000 mg | Freq: Once | INTRAMUSCULAR | Status: AC
  Administered 2023-02-14: 1000 mg via INTRAMUSCULAR

## 2023-02-14 MED ORDER — ONDANSETRON 8 MG PO TBDP: 8.0000 mg | ORAL_TABLET | Freq: Three times a day (TID) | ORAL | 0 refills | Status: DC | PRN

## 2023-02-14 MED ORDER — ONDANSETRON 8 MG PO TBDP
8.0000 mg | ORAL_TABLET | Freq: Once | ORAL | Status: AC
  Administered 2023-02-14: 8 mg via ORAL

## 2023-02-14 NOTE — ED Triage Notes (Signed)
Patient was diagnosed with strep throat on 02/08/23, currently on antibiotics, no better.  Patient is vomiting.  Medication is taken with food and water.

## 2023-02-14 NOTE — Discharge Instructions (Addendum)
Advised patient to go to [facility] ED now for further evaluation of extremely painful sore throat and to rule out tonsillar abscess.  Instructed patient may take Zofran daily or as needed for nausea/prevent vomiting.  Encouraged patient to follow-up with PCP or ENT ([facility]) for further evaluation of ongoing sore throat.

## 2023-02-14 NOTE — ED Provider Notes (Signed)
[name]    CSN: [id number] Arrival date & time: 02/14/23  0911      History   Chief Complaint Chief Complaint  Patient presents with   Sore Throat    HPI Ashley Harvey is a 27 y.o. female.   HPI 27 year old female returns with continued sore throat.  Patient reports was diagnosed and treated for strep pharyngitis here on 02/08/2023.  Reports that she is currently taking antibiotics as directed and feels no better.  Reports vomiting.  PMH significant for seizures during pregnancy.  Past Medical History:  Diagnosis Date   Seizures ([place])    [redacted] weeks pregnant; stress induced, most recent one @ 28 wks    Patient Active Problem List   Diagnosis Date Noted   Contraception management 01/31/2018   Implanon removal 01/31/2018   Protein-calorie malnutrition, severe 12/09/2015    Past Surgical History:  Procedure Laterality Date   NO PAST SURGERIES      OB History     Gravida  1   Para  1   Term  1   Preterm      AB      Living  1      SAB      IAB      Ectopic      Multiple  0   Live Births  1            Home Medications    Prior to Admission medications   Medication Sig Start Date End Date Taking? Authorizing Provider  amoxicillin (AMOXIL) 875 MG tablet Take 1 tablet (875 mg total) by mouth 2 (two) times daily for 10 days. 02/08/23 02/18/23 Yes Raylene Everts, MD  ondansetron (ZOFRAN-ODT) 8 MG disintegrating tablet Take 1 tablet (8 mg total) by mouth every 8 (eight) hours as needed for nausea or vomiting. 02/14/23  Yes Eliezer Lofts, FNP    Family History Family History  Problem Relation Age of Onset   Cancer Mother    Hyperlipidemia Father    Hypertension Father    Diabetes Father    Cancer Maternal Grandmother    Cancer Maternal Grandfather    Diabetes Paternal Grandmother    Hypertension Paternal Grandmother    Hypertension Paternal Grandfather    Cancer Maternal Aunt    Cancer Maternal Uncle     Social  History Social History   Tobacco Use   Smoking status: Every Day    Types: Cigarettes, E-cigarettes    Last attempt to quit: 10/30/2015    Years since quitting: 7.2   Smokeless tobacco: Never  Vaping Use   Vaping Use: Every day   Substances: Nicotine, Flavoring  Substance Use Topics   Alcohol use: No   Drug use: Yes    Frequency: 7.0 times per week    Types: Marijuana    Comment: last use @ 6wks preg     Allergies   Vancomycin   Review of Systems Review of Systems   Physical Exam Triage Vital Signs ED Triage Vitals  Enc Vitals Group     BP 02/14/23 0917 122/85     Pulse Rate 02/14/23 0917 89     Resp 02/14/23 0917 16     Temp 02/14/23 0917 98.4 F (36.9 C)     Temp Source 02/14/23 0917 Oral     SpO2 02/14/23 0917 96 %     Weight 02/14/23 0918 215 lb (97.5 kg)     Height 02/14/23 0918 5\' 2"  (1.575 m)  Head Circumference --      Peak Flow --      Pain Score 02/14/23 0918 10     Pain Loc --      Pain Edu? --      Excl. in [place]? --    No data found.  Updated Vital Signs BP 122/85 (BP Location: Right Arm)   Pulse 89   Temp 98.4 F (36.9 C) (Oral)   Resp 16   Ht 5\' 2"  (1.575 m)   Wt 215 lb (97.5 kg)   LMP 01/10/2023 (Approximate)   SpO2 96%   BMI 39.32 kg/m   Visual Acuity Right Eye Distance:   Left Eye Distance:   Bilateral Distance:    Right Eye Near:   Left Eye Near:    Bilateral Near:     Physical Exam Vitals and nursing note reviewed.  Constitutional:      Appearance: She is well-developed. She is obese. She is ill-appearing.  HENT:     Head: Normocephalic and atraumatic.     Right Ear: Tympanic membrane and ear canal normal.     Left Ear: Tympanic membrane and ear canal normal.     Mouth/Throat:     Mouth: Mucous membranes are moist.     Pharynx: Pharyngeal swelling, posterior oropharyngeal erythema and uvula swelling present.     Tonsils: 4+ on the right. 4+ on the left.     Comments: Concerning for tonsillar abscess given  erythematous, indurated, macerated, swelling of uvula and tonsillar region/uvula encased by tonsils bilaterally  Eyes:     Conjunctiva/sclera: Conjunctivae normal.     Pupils: Pupils are equal, round, and reactive to light.  Cardiovascular:     Rate and Rhythm: Normal rate and regular rhythm.     Pulses: Normal pulses.     Heart sounds: Normal heart sounds. No murmur heard. Pulmonary:     Effort: Pulmonary effort is normal.     Breath sounds: Normal breath sounds. No wheezing, rhonchi or rales.  Abdominal:     General: Bowel sounds are normal.     Palpations: Abdomen is soft.  Musculoskeletal:        General: Normal range of motion.     Cervical back: Normal range of motion and neck supple. Tenderness present.  Lymphadenopathy:     Cervical: Cervical adenopathy present.  Skin:    General: Skin is warm and dry.  Neurological:     General: No focal deficit present.     Mental Status: She is alert and oriented to person, place, and time.  Psychiatric:        Mood and Affect: Mood normal.        Behavior: Behavior normal.      UC Treatments / Results  Labs (all labs ordered are listed, but only abnormal results are displayed) Labs Reviewed - No data to display  EKG   Radiology No results found.  Procedures Procedures (including critical care time)  Medications Ordered in UC Medications  cefTRIAXone (ROCEPHIN) injection 1,000 mg (1,000 mg Intramuscular Given 02/14/23 0939)  ondansetron (ZOFRAN-ODT) disintegrating tablet 8 mg (8 mg Oral Given 02/14/23 0943)    Initial Impression / Assessment and Plan / UC Course  I have reviewed the triage vital signs and the nursing notes.  Pertinent labs & imaging results that were available during my care of the patient were reviewed by me and considered in my medical decision making (see chart for details).     MDM: 1.  Pharyngitis,  unspecified etiology-IM Rocephin 1000 mg given once in clinic you have a swelling erythematous  and encased by +4 tonsils bilaterally very concerning for tonsillar abscess, narrow opening of oropharynx on exam-patient advised go to [facility] ED now for further evaluation and to rule out tonsillar abscess.  Patient agreed and verbalized understanding of these instructions and this plan of care today.  Nausea-Zofran 8 mg given once in clinic prior to discharge today Zofran 8 mg Rx'd daily, as needed.  Patient discharged hemodynamically stable Final Clinical Impressions(s) / UC Diagnoses   Final diagnoses:  Pharyngitis, unspecified etiology  Nausea     Discharge Instructions      Advised patient to go to [facility] ED now for further evaluation of extremely painful sore throat and to rule out tonsillar abscess.  Instructed patient may take Zofran daily or as needed for nausea/prevent vomiting.  Encouraged patient to follow-up with PCP or ENT ([facility] and [facility]) for further evaluation of ongoing sore throat.    ED Prescriptions     Medication Sig Dispense Auth. Provider   ondansetron (ZOFRAN-ODT) 8 MG disintegrating tablet Take 1 tablet (8 mg total) by mouth every 8 (eight) hours as needed for nausea or vomiting. 24 tablet Eliezer Lofts, FNP      PDMP not reviewed this encounter.   Eliezer Lofts, [place] 02/14/23 1006

## 2023-02-15 ENCOUNTER — Telehealth: Payer: Self-pay | Admitting: Emergency Medicine

## 2023-02-15 NOTE — Telephone Encounter (Signed)
Patient states that she is feeling a lot better.  She did go to the ED as instructed to r/o tonsillar abscess which was clear.  Patient's antibiotics were changed and feels that the antibiotic injection she received here yesterday really helped.  Patient will continue medication and follow up as needed.

## 2023-02-16 ENCOUNTER — Ambulatory Visit: Payer: Medicaid Other | Admitting: Family Medicine

## 2023-02-17 ENCOUNTER — Ambulatory Visit: Payer: Medicaid Other | Admitting: Family Medicine

## 2023-02-17 ENCOUNTER — Encounter: Payer: Self-pay | Admitting: Family Medicine

## 2023-02-17 VITALS — BP 125/88 | HR 112 | Temp 98.4°F | Ht 62.0 in | Wt 223.0 lb

## 2023-02-17 DIAGNOSIS — Z7689 Persons encountering health services in other specified circumstances: Secondary | ICD-10-CM | POA: Diagnosis not present

## 2023-02-17 DIAGNOSIS — F418 Other specified anxiety disorders: Secondary | ICD-10-CM | POA: Diagnosis not present

## 2023-02-17 DIAGNOSIS — T3695XA Adverse effect of unspecified systemic antibiotic, initial encounter: Secondary | ICD-10-CM | POA: Diagnosis not present

## 2023-02-17 DIAGNOSIS — B379 Candidiasis, unspecified: Secondary | ICD-10-CM

## 2023-02-17 MED ORDER — FLUCONAZOLE 150 MG PO TABS: 150.0000 mg | ORAL_TABLET | Freq: Every day | ORAL | 1 refills | Status: DC

## 2023-02-17 NOTE — Progress Notes (Signed)
New Patient Office Visit  Subjective    Patient ID: Ashley Harvey, female    DOB: 1995/12/21  Age: 27 y.o. MRN: [id number]  CC:  Chief Complaint  Patient presents with   Establish Care    Pt states that she has recurrent strep throat.     HPI Bruce Martindale presents to establish care with this practice. She is new to me. Concerned with recurrent strep infections. Discussed with patient that it appears that the original strep infection may have been inadequately treated based on chart review.  We will keep an eye on this in the future as her symptoms are now improving. Not certain this is a recurrent infection.   Strep infection:  Chart review:  3/12: amoxicillin for strep  Not improving:  3/18: ceftriaxone IM  Ruled out abscess and antibiotic changed 3/18: clindamycin and prednisone (still taking) 3/21: feels better, symptoms have improved continues to have some tonsillar enlargement but this is better per patient.   Having vaginal issues with antibiotic use. Having vaginal discharge, no itching, having odor. Last sexual activity 5 months ago. Likely antibiotic induced yeast infection.   Depression and anxiety: long discussion around mental health and her struggles with mental health.  She is open to psychiatry referral today. Denies suicidal thoughts.  Has support from her mother with her child and rent. She is a single mother.    History reviewed: Health maintenance: last pap 2016. She will schedule CPE with pap and labs today.   [place] Office Visit from 02/17/2023 in [place] at [facility] Total Score 22      Outpatient Encounter Medications as of 02/17/2023  Medication Sig   clindamycin (CLEOCIN) 150 MG capsule Take 150 mg by mouth 4 (four) times daily.   fluconazole (DIFLUCAN) 150 MG tablet Take 1 tablet (150 mg total) by mouth daily. May repeat in 3 days if symptoms continue.   predniSONE (DELTASONE) 10 MG tablet Take 10 mg by  mouth 2 (two) times daily with a meal.   amoxicillin (AMOXIL) 875 MG tablet Take 1 tablet (875 mg total) by mouth 2 (two) times daily for 10 days. (Patient not taking: Reported on 02/17/2023)   ondansetron (ZOFRAN-ODT) 8 MG disintegrating tablet Take 1 tablet (8 mg total) by mouth every 8 (eight) hours as needed for nausea or vomiting. (Patient not taking: Reported on 02/17/2023)   No facility-administered encounter medications on file as of 02/17/2023.    Past Medical History:  Diagnosis Date   Seizures ([place])    [redacted] weeks pregnant; stress induced, most recent one @ 28 wks   Strep throat     Past Surgical History:  Procedure Laterality Date   NO PAST SURGERIES      Family History  Problem Relation Age of Onset   Cancer Mother    Hyperlipidemia Father    Hypertension Father    Diabetes Father    Cancer Maternal Grandmother    Cancer Maternal Grandfather    Diabetes Paternal Grandmother    Hypertension Paternal Grandmother    Hypertension Paternal Grandfather    Cancer Maternal Aunt    Cancer Maternal Uncle     Social History   Socioeconomic History   Marital status: Single    Spouse name: Not on file   Number of children: Not on file   Years of education: Not on file   Highest education level: Not on file  Occupational History   Not on file  Tobacco  Use   Smoking status: Every Day    Types: E-cigarettes   Smokeless tobacco: Never  Vaping Use   Vaping Use: Every day   Substances: Nicotine, Flavoring  Substance and Sexual Activity   Alcohol use: No   Drug use: Not Currently    Frequency: 7.0 times per week    Types: Marijuana    Comment: last use @ 6wks preg   Sexual activity: Not Currently    Partners: Male    Birth control/protection: None  Other Topics Concern   Not on file  Social History Narrative   Not on file   Social Determinants of Health   Financial Resource Strain: Not on file  Food Insecurity: Not on file  Transportation Needs: Not on file   Physical Activity: Not on file  Stress: Not on file  Social Connections: Not on file  Intimate Partner Violence: Not on file    Review of Systems  Constitutional:  Negative for chills and fever.  HENT:  Positive for sore throat (has improved).   Respiratory:  Negative for shortness of breath.   Cardiovascular:  Negative for chest pain.  Psychiatric/Behavioral:  Positive for depression. Negative for suicidal ideas. The patient is nervous/anxious.         Objective    BP 125/88   Pulse (!) 112   Temp 98.4 F (36.9 C) (Oral)   Ht 5\' 2"  (1.575 m)   Wt 223 lb (101.2 kg)   LMP 01/10/2023 (Approximate)   SpO2 96%   BMI 40.79 kg/m   Physical Exam Vitals and nursing note reviewed.  Constitutional:      Appearance: Normal appearance. She is obese.  HENT:     Mouth/Throat:     Pharynx: Uvula midline. Posterior oropharyngeal erythema present. No oropharyngeal exudate or uvula swelling.     Tonsils: 2+ on the right. 2+ on the left.     Comments: Reports throat has improved from 3/12 since being on clindamycin and prednisone.  Cardiovascular:     Rate and Rhythm: Regular rhythm.  Pulmonary:     Effort: Pulmonary effort is normal.     Breath sounds: Normal breath sounds.  Skin:    General: Skin is warm and dry.     Capillary Refill: Capillary refill takes less than 2 seconds.  Neurological:     General: No focal deficit present.     Mental Status: She is alert. Mental status is at baseline.  Psychiatric:        Mood and Affect: Mood normal.        Behavior: Behavior normal.        Thought Content: Thought content normal.        Judgment: Judgment normal.        Assessment & Plan:   Problem List Items Addressed This Visit     Establishing care with new doctor, encounter for - Primary   Depression with anxiety   Relevant Orders   Ambulatory referral to Psychiatry  PHQ-9 score 22. Denies suicidal thoughts. Would benefit from psychiatry evaluation and treatment.     Other Visit Diagnoses     Antibiotic-induced yeast infection       Relevant Medications   fluconazole (DIFLUCAN) 150 MG tablet  Has been on 3 antibiotics since 02/08/23 with vaginal itching and discharge.  She may repeat fluconazole 150 mg if symptoms persist after 3 days.      Agrees with plan of care discussed.  Questions answered. Return for CPE with labs  and pap.    Return in about 4 weeks (around 03/17/2023) for cpe with labs .   Chalmers Guest, FNP

## 2023-03-17 ENCOUNTER — Encounter: Payer: Medicaid Other | Admitting: Family Medicine

## 2023-03-24 ENCOUNTER — Ambulatory Visit (HOSPITAL_COMMUNITY): Payer: Medicaid Other | Admitting: Psychiatry

## 2023-10-31 ENCOUNTER — Other Ambulatory Visit: Payer: Self-pay

## 2023-10-31 ENCOUNTER — Ambulatory Visit
Admission: EM | Admit: 2023-10-31 | Discharge: 2023-10-31 | Disposition: A | Discharge status: Home / Self Care | Payer: MEDICAID | Attending: Family Medicine | Admitting: Family Medicine

## 2023-10-31 DIAGNOSIS — R070 Pain in throat: Secondary | ICD-10-CM | POA: Insufficient documentation

## 2023-10-31 DIAGNOSIS — J039 Acute tonsillitis, unspecified: Secondary | ICD-10-CM | POA: Diagnosis not present

## 2023-10-31 LAB — POCT RAPID STREP A (OFFICE): Rapid Strep A Screen: NEGATIVE

## 2023-10-31 MED ORDER — AMOXICILLIN 875 MG PO TABS: 875.0000 mg | ORAL_TABLET | Freq: Two times a day (BID) | ORAL | 0 refills | Status: DC

## 2023-10-31 NOTE — Discharge Instructions (Signed)
Take the amoxicillin 2 times a day Check MyChart for your test result.  The throat swab has been sent for culture If your culture is positive you need to take all of the amoxicillin If your culture is negative you can stop the amoxicillin as soon as you feel better Take Tylenol, ibuprofen, or Aleve for the throat pain.  Drink lots of water. See your doctor if not better by next week

## 2023-10-31 NOTE — ED Triage Notes (Signed)
C/o sore throat x 3 days, reports throat is red and has white spots on it. Has had no fever.

## 2023-11-03 LAB — CULTURE, GROUP A STREP (THRC)

## 2024-01-06 ENCOUNTER — Other Ambulatory Visit: Payer: Self-pay

## 2024-01-06 ENCOUNTER — Ambulatory Visit
Admission: EM | Admit: 2024-01-06 | Discharge: 2024-01-06 | Disposition: A | Discharge status: Home / Self Care | Payer: MEDICAID | Attending: Family Medicine | Admitting: Family Medicine

## 2024-01-06 ENCOUNTER — Ambulatory Visit (INDEPENDENT_AMBULATORY_CARE_PROVIDER_SITE_OTHER): Payer: MEDICAID

## 2024-01-06 DIAGNOSIS — J189 Pneumonia, unspecified organism: Secondary | ICD-10-CM

## 2024-01-06 DIAGNOSIS — R059 Cough, unspecified: Secondary | ICD-10-CM | POA: Diagnosis not present

## 2024-01-06 MED ORDER — BENZONATATE 200 MG PO CAPS: 200.0000 mg | ORAL_CAPSULE | Freq: Three times a day (TID) | ORAL | 0 refills | Status: AC | PRN

## 2024-01-06 MED ORDER — AMOXICILLIN-POT CLAVULANATE 875-125 MG PO TABS: 1.0000 | ORAL_TABLET | Freq: Two times a day (BID) | ORAL | 0 refills | Status: AC

## 2024-01-06 MED ORDER — AZITHROMYCIN 250 MG PO TABS: 250.0000 mg | ORAL_TABLET | Freq: Every day | ORAL | 0 refills | Status: DC

## 2024-01-06 MED ORDER — PROMETHAZINE-DM 6.25-15 MG/5ML PO SYRP: 5.0000 mL | ORAL_SOLUTION | Freq: Two times a day (BID) | ORAL | 0 refills | Status: DC | PRN

## 2024-01-06 NOTE — Discharge Instructions (Addendum)
 Advised patient to take medications as directed with food to completion.  Advised patient to take Zithromax  with first dose of Augmentin  for the next 10 days.  Advised may take Tessalon  capsules daily or as needed for cough.  Advised may take Promethazine  DM at night for cough prior to sleep due to sedative effects.  Encouraged increase daily water intake to 64 ounces per day while taking these medications.  Advised if symptoms worsen and/or unresolved please follow-up with PCP or here for further evaluation.  Advised patient to repeat chest x-ray on or about February 03, 2024 to ensure consolidation/pneumonia has resolved.

## 2024-01-06 NOTE — ED Provider Notes (Signed)
 [facility]    CSN: [id number] Arrival date & time: 01/06/24  1423      History   Chief Complaint Chief Complaint  Patient presents with   Pleurisy    HPI Ashley Harvey is a 28 y.o. female.   HPI 28 year old female presents with chest pain when coughing for 3 weeks.  Reports having COVID 3 weeks ago and has been coughing since.  Chest pain has worsened.  PMH significant for acute psychosis, morbid obesity, and drug abuse.  Patient is accompanied by her significant other this evening.  Past Medical History:  Diagnosis Date   Seizures ([facility])    [redacted] weeks pregnant; stress induced, most recent one @ 28 wks   Strep throat     Patient Active Problem List   Diagnosis Date Noted   Acute psychosis (HCC) 02/20/2022   Depression with anxiety 02/20/2022   Current moderate episode of major depressive disorder without prior episode (HCC) 02/20/2022   Establishing care with new doctor, encounter for 01/31/2018   Implanon  removal 01/31/2018   Protein-calorie malnutrition, severe 12/09/2015   Drug abuse (HCC) 01/09/2015   Suicide attempt (HCC) 01/09/2015    Past Surgical History:  Procedure Laterality Date   dnc     NO PAST SURGERIES      OB History     Gravida  1   Para  1   Term  1   Preterm      AB      Living  1      SAB      IAB      Ectopic      Multiple  0   Live Births  1            Home Medications    Prior to Admission medications   Medication Sig Start Date End Date Taking? Authorizing Provider  amoxicillin -clavulanate (AUGMENTIN ) 875-125 MG tablet Take 1 tablet by mouth 2 (two) times daily for 10 days. 01/06/24 01/16/24 Yes Teddy Sharper, FNP  azithromycin  (ZITHROMAX ) 250 MG tablet Take 1 tablet (250 mg total) by mouth daily. Take first 2 tablets together, then 1 every day until finished. 01/06/24  Yes Teddy Sharper, FNP  benzonatate  (TESSALON ) 200 MG capsule Take 1 capsule (200 mg total) by mouth 3 (three) times daily as needed  for up to 7 days. 01/06/24 01/13/24 Yes Teddy Sharper, FNP  promethazine -dextromethorphan (PROMETHAZINE -DM) 6.25-15 MG/5ML syrup Take 5 mLs by mouth 2 (two) times daily as needed for cough. 01/06/24  Yes Teddy Sharper, FNP  sertraline (ZOLOFT) 50 MG tablet Take 50 mg by mouth daily.    [provider]    Family History Family History  Problem Relation Age of Onset   Cancer Mother    Hyperlipidemia Father    Hypertension Father    Diabetes Father    Cancer Maternal Grandmother    Cancer Maternal Grandfather    Diabetes Paternal Grandmother    Hypertension Paternal Grandmother    Hypertension Paternal Grandfather    Cancer Maternal Aunt    Cancer Maternal Uncle     Social History Social History   Tobacco Use   Smoking status: Every Day    Types: E-cigarettes   Smokeless tobacco: Never  Vaping Use   Vaping status: Every Day   Substances: Nicotine, Flavoring  Substance Use Topics   Alcohol use: No   Drug use: Not Currently    Frequency: 7.0 times per week    Types: Marijuana  Comment: last use @ 6wks preg     Allergies   Vancomycin   Review of Systems Review of Systems  Respiratory:  Positive for cough and chest tightness.   All other systems reviewed and are negative.    Physical Exam Triage Vital Signs ED Triage Vitals  Encounter Vitals Group     BP 01/06/24 1539 105/75     Systolic BP Percentile --      Diastolic BP Percentile --      Pulse Rate 01/06/24 1539 85     Resp 01/06/24 1539 16     Temp 01/06/24 1539 98.6 F (37 C)     Temp src --      SpO2 01/06/24 1539 98 %     Weight --      Height --      Head Circumference --      Peak Flow --      Pain Score 01/06/24 1538 10     Pain Loc --      Pain Education --      Exclude from Growth Chart --    No data found.  Updated Vital Signs BP 105/75   Pulse 85   Temp 98.6 F (37 C)   Resp 16   LMP 12/27/2023 (Approximate)   SpO2 98%      Physical Exam Vitals and nursing note  reviewed.  Constitutional:      Appearance: Normal appearance. She is normal weight. She is ill-appearing.  HENT:     Head: Normocephalic and atraumatic.     Right Ear: Tympanic membrane, ear canal and external ear normal.     Left Ear: Tympanic membrane and ear canal normal.     Mouth/Throat:     Mouth: Mucous membranes are moist.     Pharynx: Oropharynx is clear.  Eyes:     Extraocular Movements: Extraocular movements intact.     Conjunctiva/sclera: Conjunctivae normal.     Pupils: Pupils are equal, round, and reactive to light.  Cardiovascular:     Rate and Rhythm: Normal rate and regular rhythm.     Pulses: Normal pulses.     Heart sounds: Normal heart sounds.  Pulmonary:     Effort: Pulmonary effort is normal.     Breath sounds: No wheezing, rhonchi or rales.     Comments: Diminished breath sounds on exam, infrequent nonproductive cough noted Musculoskeletal:        General: Normal range of motion.     Cervical back: Normal range of motion and neck supple.  Skin:    General: Skin is warm and dry.  Neurological:     General: No focal deficit present.     Mental Status: She is alert and oriented to person, place, and time. Mental status is at baseline.  Psychiatric:        Mood and Affect: Mood normal.        Behavior: Behavior normal.        Thought Content: Thought content normal.      UC Treatments / Results  Labs (all labs ordered are listed, but only abnormal results are displayed) Labs Reviewed - No data to display  EKG   Radiology DG Chest 2 View Result Date: 01/06/2024 CLINICAL DATA:  Cough for multiple weeks.  Diminished breath sounds. EXAM: CHEST - 2 VIEW COMPARISON:  None FINDINGS: Normal cardiac and mediastinal contours. Patchy consolidative opacities demonstrated best on the lateral view anteriorly. No pleural effusion or pneumothorax. Osseous structures unremarkable.  IMPRESSION: Patchy consolidative opacities demonstrated best on the lateral view  anteriorly. Findings are concerning for infection. Recommend follow-up chest radiograph in 6-8 weeks to ensure resolution. Electronically Signed   By: Bard Moats M.D.   On: 01/06/2024 16:31    Procedures Procedures (including critical care time)  Medications Ordered in UC Medications - No data to display  Initial Impression / Assessment and Plan / UC Course  I have reviewed the triage vital signs and the nursing notes.  Pertinent labs & imaging results that were available during my care of the patient were reviewed by me and considered in my medical decision making (see chart for details).     MDM: 1.  Community-acquired pneumonia, unspecified laterality-CXR revealed above, Rx'd Augmentin  875/125 mg tablet: Take 1 tablet twice daily x 10 days, Rx'd Zithromax  (500 mg day 1, then 250 mg day 2-5). 2.  Cough, unspecified type-Rx'd promethazine  DM 6.25-15 Mg/5 mL syrup: Take 5 mL twice daily, as needed for cough, Rx'd Tessalon  200 mg capsules: Take 1 capsule 3 times daily, as needed for cough. Advised patient to take medications as directed with food to completion.  Advised patient to take Zithromax  with first dose of Augmentin  for the next 10 days.  Advised may take Tessalon  capsules daily or as needed for cough.  Advised may take Promethazine  DM at night for cough prior to sleep due to sedative effects.  Encouraged increase daily water intake to 64 ounces per day while taking these medications.  Advised if symptoms worsen and/or unresolved please follow-up with PCP or here for further evaluation.  Advised patient to repeat chest x-ray on or about February 03, 2024 to ensure consolidation/pneumonia has resolved.  Final Clinical Impressions(s) / UC Diagnoses   Final diagnoses:  Cough, unspecified type  Community acquired pneumonia, unspecified laterality     Discharge Instructions      Advised patient to take medications as directed with food to completion.  Advised patient to take Zithromax   with first dose of Augmentin  for the next 10 days.  Advised may take Tessalon  capsules daily or as needed for cough.  Advised may take Promethazine  DM at night for cough prior to sleep due to sedative effects.  Encouraged increase daily water intake to 64 ounces per day while taking these medications.  Advised if symptoms worsen and/or unresolved please follow-up with PCP or here for further evaluation.  Advised patient to repeat chest x-ray on or about February 03, 2024 to ensure consolidation/pneumonia has resolved.     ED Prescriptions     Medication Sig Dispense Auth. Provider   azithromycin  (ZITHROMAX ) 250 MG tablet Take 1 tablet (250 mg total) by mouth daily. Take first 2 tablets together, then 1 every day until finished. 6 tablet Kamran Coker, FNP   amoxicillin -clavulanate (AUGMENTIN ) 875-125 MG tablet Take 1 tablet by mouth 2 (two) times daily for 10 days. 20 tablet Genavive Kubicki, FNP   benzonatate  (TESSALON ) 200 MG capsule Take 1 capsule (200 mg total) by mouth 3 (three) times daily as needed for up to 7 days. 40 capsule Asael Pann, FNP   promethazine -dextromethorphan (PROMETHAZINE -DM) 6.25-15 MG/5ML syrup Take 5 mLs by mouth 2 (two) times daily as needed for cough. 118 mL Sixto Bowdish, FNP      PDMP not reviewed this encounter.   Teddy Sharper, FNP 01/06/24 [phone]

## 2024-01-06 NOTE — ED Triage Notes (Signed)
 Pt presents to [facility]  with co of chest pain when coughing for 3 weeks. Pt reports she had covid 3 weeks ago and has since been coughing and chest pain has worsened. Pt reports she has been using so inhaler for symptoms and sob.

## 2024-04-06 ENCOUNTER — Ambulatory Visit (INDEPENDENT_AMBULATORY_CARE_PROVIDER_SITE_OTHER): Payer: MEDICAID

## 2024-04-06 ENCOUNTER — Ambulatory Visit
Admission: EM | Admit: 2024-04-06 | Discharge: 2024-04-06 | Disposition: A | Discharge status: Home / Self Care | Payer: MEDICAID | Attending: Family Medicine | Admitting: Family Medicine

## 2024-04-06 DIAGNOSIS — S29011A Strain of muscle and tendon of front wall of thorax, initial encounter: Secondary | ICD-10-CM | POA: Diagnosis not present

## 2024-04-06 DIAGNOSIS — R0781 Pleurodynia: Secondary | ICD-10-CM

## 2024-04-06 DIAGNOSIS — R059 Cough, unspecified: Secondary | ICD-10-CM

## 2024-04-06 DIAGNOSIS — J209 Acute bronchitis, unspecified: Secondary | ICD-10-CM | POA: Diagnosis not present

## 2024-04-06 MED ORDER — DOXYCYCLINE HYCLATE 100 MG PO CAPS: ORAL_CAPSULE | ORAL | 0 refills | Status: DC

## 2024-04-06 MED ORDER — PREDNISONE 20 MG PO TABS: ORAL_TABLET | ORAL | 0 refills | Status: DC

## 2024-04-06 MED ORDER — ACETAMINOPHEN 325 MG PO TABS
650.0000 mg | ORAL_TABLET | Freq: Once | ORAL | Status: AC
  Administered 2024-04-06: 650 mg via ORAL

## 2024-04-06 MED ORDER — SYMBICORT 80-4.5 MCG/ACT IN AERO: 2.0000 | INHALATION_SPRAY | Freq: Two times a day (BID) | RESPIRATORY_TRACT | 0 refills | Status: AC

## 2024-04-06 MED ORDER — VENTOLIN HFA 108 (90 BASE) MCG/ACT IN AERS: 2.0000 | INHALATION_SPRAY | Freq: Four times a day (QID) | RESPIRATORY_TRACT | 0 refills | Status: AC | PRN

## 2024-04-06 MED ORDER — METHYLPREDNISOLONE SODIUM SUCC 125 MG IJ SOLR
80.0000 mg | Freq: Once | INTRAMUSCULAR | Status: AC
  Administered 2024-04-06: 80 mg via INTRAMUSCULAR

## 2024-04-06 NOTE — ED Triage Notes (Signed)
 Pt c/o intermittent Cough and RT rib pain x 3 months. Yesterday had a coughing fit so bad it caused a possible rib injury. Hx of pneumonia. Ibuprofen  prn. Last dose at 9am this morning.

## 2024-04-06 NOTE — ED Notes (Signed)
 Ice pack provided

## 2024-04-06 NOTE — Discharge Instructions (Addendum)
 Begin prednisone  Saturday 04/07/24. May take plain guaifenesin (1200mg  extended release tabs such as Mucinex) twice daily, with plenty of water, for cough and congestion.    May take Tylenol  500mg , 2 tabs every 6 to 8 hours as needed for pain. Apply ice pack to painful area on right chest for 20 to 30 minutes, 3 to 4 times daily  Continue until pain and swelling decrease.   If symptoms become significantly worse during the night or over the weekend, proceed to the local emergency room.

## 2024-04-06 NOTE — ED Provider Notes (Signed)
 [facility]    CSN: [id number] Arrival date & time: 04/06/24  1253      History   Chief Complaint Chief Complaint  Patient presents with   Cough    W/ RT rib pain    HPI Ashley Harvey is a 28 y.o. female.   Patient complains of intermittent cough for 3 months.  Yesterday she had a prolonged bout of coughing that resulted in sudden sharp pain in her right lateral chest.  She complains of wheezing and shortness of breath with activity. Review of records reveals that she had COVID infection in mid-January and was treated for community acquired pneumonia on 01/06/24. Three weeks after her treatment she presented to the [facility] ED for persistent shortness of breath, cough, and wheezing.  She was treated at that time with a course of prednisone  and an albuterol inhaler.  She states that she improved, but her cough recurred two weeks ago, now productive of yellow/green sputum.  She reports that her previously prescribed albuterol inhaler is somewhat helpful. Her family history is significant for multiple maternal relatives with asthma.  The history is provided by the patient.    Past Medical History:  Diagnosis Date   Seizures (HCC)    [redacted] weeks pregnant; stress induced, most recent one @ 28 wks   Strep throat     Patient Active Problem List   Diagnosis Date Noted   Acute psychosis (HCC) 02/20/2022   Depression with anxiety 02/20/2022   Current moderate episode of major depressive disorder without prior episode (HCC) 02/20/2022   Establishing care with new doctor, encounter for 01/31/2018   Implanon  removal 01/31/2018   Protein-calorie malnutrition, severe 12/09/2015   Drug abuse (HCC) 01/09/2015   Suicide attempt (HCC) 01/09/2015    Past Surgical History:  Procedure Laterality Date   dnc     NO PAST SURGERIES      OB History     Gravida  1   Para  1   Term  1   Preterm      AB      Living  1      SAB      IAB      Ectopic      Multiple  0    Live Births  1            Home Medications    Prior to Admission medications   Medication Sig Start Date End Date Taking? Authorizing Provider  albuterol (VENTOLIN HFA) 108 (90 Base) MCG/ACT inhaler Inhale 2 puffs into the lungs every 6 (six) hours as needed for wheezing or shortness of breath. 04/06/24  Yes Leon Rajas, MD  budesonide-formoterol (SYMBICORT) 80-4.5 MCG/ACT inhaler Inhale 2 puffs into the lungs 2 (two) times daily. 04/06/24  Yes Leon Rajas, MD  doxycycline (VIBRAMYCIN) 100 MG capsule Take one cap PO Q12hr with food. 04/06/24  Yes Leon Rajas, MD  predniSONE  (DELTASONE ) 20 MG tablet Take one tab by mouth twice daily for 4 days, then one daily for 3 days. Take with food. 04/06/24  Yes Leon Rajas, MD  sertraline (ZOLOFT) 50 MG tablet Take 50 mg by mouth daily.    [provider]    Family History Family History  Problem Relation Age of Onset   Cancer Mother    Hyperlipidemia Father    Hypertension Father    Diabetes Father    Cancer Maternal Grandmother    Cancer Maternal Grandfather    Diabetes  Paternal Grandmother    Hypertension Paternal Grandmother    Hypertension Paternal Grandfather    Cancer Maternal Aunt    Cancer Maternal Uncle     Social History Social History   Tobacco Use   Smoking status: Every Day    Types: E-cigarettes   Smokeless tobacco: Never  Vaping Use   Vaping status: Every Day   Substances: Nicotine, Flavoring  Substance Use Topics   Alcohol use: No   Drug use: Not Currently    Frequency: 7.0 times per week    Types: Marijuana    Comment: last use @ 6wks preg     Allergies   Vancomycin   Review of Systems Review of Systems No sore throat + cough + right pleuritic pain + wheezing + nasal congestion ? post-nasal drainage No sinus pain/pressure No itchy/red eyes No earache No hemoptysis + SOB No fever/chills No nausea No vomiting No abdominal pain No diarrhea No urinary symptoms No  skin rash + fatigue No myalgias No headache   Physical Exam Triage Vital Signs ED Triage Vitals [04/06/24 1331]  Encounter Vitals Group     BP      Systolic BP Percentile      Diastolic BP Percentile      Pulse      Resp      Temp      Temp src      SpO2      Weight      Height      Head Circumference      Peak Flow      Pain Score 0     Pain Loc      Pain Education      Exclude from Growth Chart    No data found.  Updated Vital Signs There were no vitals taken for this visit.  Visual Acuity Right Eye Distance:   Left Eye Distance:   Bilateral Distance:    Right Eye Near:   Left Eye Near:    Bilateral Near:     Physical Exam Pulmonary:    Chest:     Nursing notes reviewed. Appearance:  Patient appears stated age, and in no acute distress Eyes:  Pupils are equal, round, and reactive to light and accomodation.  Extraocular movement is intact.  Conjunctivae are not inflamed  Ears:  Canals normal.  Tympanic membranes normal.  Nose:  Mildly congested turbinates.  No sinus tenderness.  Pharynx:  Normal; moist mucous membranes  Neck:  Supple. .No adenopathy  Lungs: Diffuse inspiratory/expiratory wheezes in all fields.  Breath sounds are equal.  Heart:  Regular rate and rhythm without murmurs, rubs, or gallops.  Abdomen:  Nontender without masses or hepatosplenomegaly.  Bowel sounds are present.  No CVA or flank tenderness.  Extremities:  No edema.  Skin:  No rash present.    UC Treatments / Results  Labs (all labs ordered are listed, but only abnormal results are displayed) Labs Reviewed - No data to display  EKG   Radiology DG Ribs Unilateral W/Chest Right Result Date: 04/06/2024 CLINICAL DATA:  Right rib pain after coughing. EXAM: RIGHT RIBS AND CHEST - 3+ VIEW COMPARISON:  January 06, 2024. FINDINGS: No fracture or other bone lesions are seen involving the ribs. There is no evidence of pneumothorax or pleural effusion. Both lungs are clear. Heart  size and mediastinal contours are within normal limits. IMPRESSION: Negative. Electronically Signed   By: Rosalene Colon M.D.   On: 04/06/2024 16:27  Procedures Procedures (including critical care time)  Medications Ordered in UC Medications  acetaminophen  (TYLENOL ) tablet 650 mg (650 mg Oral Given 04/06/24 1413)  methylPREDNISolone  sodium succinate (SOLU-MEDROL ) 125 mg/2 mL injection 80 mg (80 mg Intramuscular Given 04/06/24 1653)    Initial Impression / Assessment and Plan / UC Course  I have reviewed the triage vital signs and the nursing notes.  Pertinent labs & imaging results that were available during my care of the patient were reviewed by me and considered in my medical decision making (see chart for details).  Clinical Course as of 04/07/24 1423  Fri Apr 06, 2024  1507 DG Ribs Unilateral W/Chest Right [SB]    Clinical Course User Index [SB] Leon Rajas, MD   Suspect reactive airways disease. Administered Solumedrol 80mg  IM.  Tomorrow begin prednisone  burst/taper.  Begin empiric doxycycline 100mg  Q12 hr for one week. Rx for albuterol inhaler.  Begin Symbicort 80-4.5, 2 puffs BID. Followup with Family Doctor if not improved in one week.   Final Clinical Impressions(s) / UC Diagnoses   Final diagnoses:  Rib pain on right side  Intercostal muscle strain, initial encounter  Acute bronchitis with bronchospasm     Discharge Instructions      Begin prednisone  Saturday 04/07/24. May take plain guaifenesin (1200mg  extended release tabs such as Mucinex) twice daily, with plenty of water, for cough and congestion.    May take Tylenol  500mg , 2 tabs every 6 to 8 hours as needed for pain. Apply ice pack to painful area on right chest for 20 to 30 minutes, 3 to 4 times daily  Continue until pain and swelling decrease.   If symptoms become significantly worse during the night or over the weekend, proceed to the local emergency room.    ED Prescriptions     Medication Sig  Dispense Auth. Provider   doxycycline (VIBRAMYCIN) 100 MG capsule Take one cap PO Q12hr with food. 14 capsule Leon Rajas, MD   predniSONE  (DELTASONE ) 20 MG tablet Take one tab by mouth twice daily for 4 days, then one daily for 3 days. Take with food. 11 tablet Leon Rajas, MD   albuterol (VENTOLIN HFA) 108 (90 Base) MCG/ACT inhaler Inhale 2 puffs into the lungs every 6 (six) hours as needed for wheezing or shortness of breath. 54 g Leon Rajas, MD   budesonide-formoterol [facility]) 80-4.5 MCG/ACT inhaler Inhale 2 puffs into the lungs 2 (two) times daily. 10.2 g Leon Rajas, MD         Leon Rajas, MD 04/07/24 1430

## 2024-05-18 ENCOUNTER — Encounter (HOSPITAL_COMMUNITY): Payer: Self-pay | Admitting: Obstetrics and Gynecology

## 2024-05-18 ENCOUNTER — Inpatient Hospital Stay (HOSPITAL_COMMUNITY)
Admission: AD | Admit: 2024-05-18 | Discharge: 2024-05-19 | Disposition: A | Discharge status: Home / Self Care | Payer: MEDICAID | Attending: Obstetrics and Gynecology | Admitting: Obstetrics and Gynecology

## 2024-05-18 ENCOUNTER — Other Ambulatory Visit: Payer: Self-pay

## 2024-05-18 DIAGNOSIS — Z3A15 15 weeks gestation of pregnancy: Secondary | ICD-10-CM | POA: Diagnosis not present

## 2024-05-18 DIAGNOSIS — O210 Mild hyperemesis gravidarum: Secondary | ICD-10-CM | POA: Insufficient documentation

## 2024-05-18 LAB — CBC WITH DIFFERENTIAL/PLATELET
Abs Immature Granulocytes: 0.07 10*3/uL (ref 0.00–0.07)
Basophils Absolute: 0.1 10*3/uL (ref 0.0–0.1)
Basophils Relative: 1 %
Eosinophils Absolute: 0.1 10*3/uL (ref 0.0–0.5)
Eosinophils Relative: 0 %
HCT: 47.2 % — ABNORMAL HIGH (ref 36.0–46.0)
Hemoglobin: 16.3 g/dL — ABNORMAL HIGH (ref 12.0–15.0)
Immature Granulocytes: 0 %
Lymphocytes Relative: 21 %
Lymphs Abs: 3.8 10*3/uL (ref 0.7–4.0)
MCH: 30.6 pg (ref 26.0–34.0)
MCHC: 34.5 g/dL (ref 30.0–36.0)
MCV: 88.6 fL (ref 80.0–100.0)
Monocytes Absolute: 0.9 10*3/uL (ref 0.1–1.0)
Monocytes Relative: 5 %
Neutro Abs: 13.4 10*3/uL — ABNORMAL HIGH (ref 1.7–7.7)
Neutrophils Relative %: 73 %
Platelets: 312 10*3/uL (ref 150–400)
RBC: 5.33 MIL/uL — ABNORMAL HIGH (ref 3.87–5.11)
RDW: 12.9 % (ref 11.5–15.5)
WBC: 18.4 10*3/uL — ABNORMAL HIGH (ref 4.0–10.5)
nRBC: 0 % (ref 0.0–0.2)

## 2024-05-18 LAB — URINALYSIS, ROUTINE W REFLEX MICROSCOPIC
Bacteria, UA: NONE SEEN
Bilirubin Urine: NEGATIVE
Glucose, UA: NEGATIVE mg/dL
Hgb urine dipstick: NEGATIVE
Ketones, ur: 80 mg/dL — AB
Leukocytes,Ua: NEGATIVE
Nitrite: NEGATIVE
Protein, ur: 100 mg/dL — AB
Specific Gravity, Urine: 1.03 (ref 1.005–1.030)
pH: 5 (ref 5.0–8.0)

## 2024-05-18 LAB — COMPREHENSIVE METABOLIC PANEL WITH GFR
ALT: 34 U/L (ref 0–44)
AST: 33 U/L (ref 15–41)
Albumin: 3.9 g/dL (ref 3.5–5.0)
Alkaline Phosphatase: 51 U/L (ref 38–126)
Anion gap: 15 (ref 5–15)
BUN: 6 mg/dL (ref 6–20)
CO2: 18 mmol/L — ABNORMAL LOW (ref 22–32)
Calcium: 9.3 mg/dL (ref 8.9–10.3)
Chloride: 101 mmol/L (ref 98–111)
Creatinine, Ser: 0.89 mg/dL (ref 0.44–1.00)
GFR, Estimated: >60 mL/min (ref >60)
Glucose, Bld: 79 mg/dL (ref 70–99)
Potassium: 3.4 mmol/L — ABNORMAL LOW (ref 3.5–5.1)
Sodium: 134 mmol/L — ABNORMAL LOW (ref 135–145)
Total Bilirubin: 1.9 mg/dL — ABNORMAL HIGH (ref 0.0–1.2)
Total Protein: 7.6 g/dL (ref 6.5–8.1)

## 2024-05-18 MED ORDER — FAMOTIDINE IN NACL 20-0.9 MG/50ML-% IV SOLN
20.0000 mg | Freq: Once | INTRAVENOUS | Status: AC
Start: 2024-05-18 — End: 2024-05-18
  Administered 2024-05-18: 20 mg via INTRAVENOUS
  Filled 2024-05-18: qty 50

## 2024-05-18 MED ORDER — ONDANSETRON HCL 4 MG/2ML IJ SOLN
4.0000 mg | Freq: Once | INTRAMUSCULAR | Status: AC
  Administered 2024-05-18: 4 mg via INTRAVENOUS
  Filled 2024-05-18: qty 2

## 2024-05-18 MED ORDER — LACTATED RINGERS IV BOLUS
1000.0000 mL | Freq: Once | INTRAVENOUS | Status: AC
  Administered 2024-05-18: 1000 mL via INTRAVENOUS

## 2024-05-18 MED ORDER — DEXTROSE IN LACTATED RINGERS 5 % IV SOLN
Freq: Once | INTRAVENOUS | Status: AC
  Filled 2024-05-18: qty 10

## 2024-05-18 MED ORDER — SODIUM CHLORIDE 0.9 % IV SOLN
25.0000 mg | Freq: Once | INTRAVENOUS | Status: AC
  Administered 2024-05-19: 25 mg via INTRAVENOUS
  Filled 2024-05-18 (×3): qty 1

## 2024-05-18 NOTE — MAU Provider Note (Signed)
 History     CSN: [id number]  Arrival date and time: 05/18/24 2118   Event Date/Time   First Provider Initiated Contact with Patient 05/18/24 2214      Chief Complaint  Patient presents with   Emesis   Ashley Harvey is a 28 y.o. [id number] at 15w who receives care at [facility].  She presents today for nausea and vomiting.  Patient reports she has had multiple incidents of vomiting, but does not recall how many.  She states she has taken phenergan  suppository, but discontinued d/t diarrhea.  She took phenergan  tablet at 7pm with no relief.  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  Patient endorses having this in previous pregnancies and had multiple hospital visits and infusions. She reports having saltines today, but unable to keep them down.  However, she has been able to drink water.  No vaginal concerns.    OB History     Gravida  4   Para  1   Term  1   Preterm      AB  2   Living  1      SAB  2   IAB  0   Ectopic  0   Multiple  0   Live Births  1           Past Medical History:  Diagnosis Date   Seizures (HCC)    [redacted] weeks pregnant; stress induced, most recent one @ 28 wks   Strep throat     Past Surgical History:  Procedure Laterality Date   dnc     NO PAST SURGERIES      Family History  Problem Relation Age of Onset   Cancer Mother    Hyperlipidemia Father    Hypertension Father    Diabetes Father    Cancer Maternal Grandmother    Cancer Maternal Grandfather    Diabetes Paternal Grandmother    Hypertension Paternal Grandmother    Hypertension Paternal Grandfather    Cancer Maternal Aunt    Cancer Maternal Uncle     Social History   Tobacco Use   Smoking status: Every Day    Types: E-cigarettes   Smokeless tobacco: Never  Vaping Use   Vaping status: Every Day   Substances: Nicotine, Flavoring  Substance Use Topics   Alcohol use: No   Drug use: Not Currently    Frequency: 7.0 times per week     Types: Marijuana    Comment: last use @ 6wks preg    Allergies:  Allergies  Allergen Reactions   Vancomycin Anaphylaxis    Medications Prior to Admission  Medication Sig Dispense Refill Last Dose/Taking   promethazine  (PHENERGAN ) 25 MG tablet Take 25 mg by mouth every 6 (six) hours as needed for nausea or vomiting.   05/18/2024 at  7:00 AM   albuterol  (VENTOLIN  HFA) 108 (90 Base) MCG/ACT inhaler Inhale 2 puffs into the lungs every 6 (six) hours as needed for wheezing or shortness of breath. 54 g 0 05/15/2024   budesonide-formoterol (SYMBICORT ) 80-4.5 MCG/ACT inhaler Inhale 2 puffs into the lungs 2 (two) times daily. 10.2 g 0 05/04/2024   doxycycline  (VIBRAMYCIN ) 100 MG capsule Take one cap PO Q12hr with food. 14 capsule 0 03/05/2024   predniSONE  (DELTASONE ) 20 MG tablet Take one tab by mouth twice daily for 4 days, then one daily for 3 days. Take with food. 11 tablet 0 03/29/2024   promethazine  (PHENERGAN ) 25 MG suppository Place 25 mg rectally  every 6 (six) hours as needed for nausea or vomiting.      sertraline (ZOLOFT) 50 MG tablet Take 50 mg by mouth daily.   More than a month    Review of Systems  Gastrointestinal:  Positive for nausea and vomiting. Negative for abdominal pain.  Genitourinary:  Negative for difficulty urinating, dysuria, vaginal bleeding and vaginal discharge.  Neurological:  Negative for headaches.   Physical Exam   Blood pressure 127/74, pulse 84, temperature 97.7 F (36.5 C), temperature source Oral, resp. rate 12, height 5' 2 (1.575 m), weight 98.3 kg, last menstrual period 02/03/2024.  Physical Exam Vitals and nursing note reviewed.  Constitutional:      General: She is not in acute distress.    Appearance: Normal appearance. She is not ill-appearing.  HENT:     Head: Normocephalic and atraumatic.   Eyes:     Conjunctiva/sclera: Conjunctivae normal.    Cardiovascular:     Rate and Rhythm: Normal rate.  Pulmonary:     Effort: Pulmonary effort is  normal. No respiratory distress.   Musculoskeletal:        General: Normal range of motion.     Cervical back: Normal range of motion.   Skin:    General: Skin is warm and dry.   Neurological:     Mental Status: She is alert and oriented to person, place, and time.   Psychiatric:        Mood and Affect: Mood normal.        Behavior: Behavior normal.    MAU Course  Procedures Results for orders placed or performed during the hospital encounter of 05/18/24 (from the past 24 hours)  Urinalysis, Routine w reflex microscopic -Urine, Clean Catch     Status: Abnormal   Collection Time: 05/18/24 10:17 PM  Result Value Ref Range   Color, Urine AMBER (A) YELLOW   APPearance CLEAR CLEAR   Specific Gravity, Urine 1.030 1.005 - 1.030   pH 5.0 5.0 - 8.0   Glucose, UA NEGATIVE NEGATIVE mg/dL   Hgb urine dipstick NEGATIVE NEGATIVE   Bilirubin Urine NEGATIVE NEGATIVE   Ketones, ur 80 (A) NEGATIVE mg/dL   Protein, ur 899 (A) NEGATIVE mg/dL   Nitrite NEGATIVE NEGATIVE   Leukocytes,Ua NEGATIVE NEGATIVE   RBC / HPF 0-5 0 - 5 RBC/hpf   WBC, UA 0-5 0 - 5 WBC/hpf   Bacteria, UA NONE SEEN NONE SEEN   Squamous Epithelial / HPF 0-5 0 - 5 /HPF   Mucus PRESENT    Hyaline Casts, UA PRESENT   CBC with Differential/Platelet     Status: Abnormal   Collection Time: 05/18/24 10:46 PM  Result Value Ref Range   WBC 18.4 (H) 4.0 - 10.5 K/uL   RBC 5.33 (H) 3.87 - 5.11 MIL/uL   Hemoglobin 16.3 (H) 12.0 - 15.0 g/dL   HCT 52.7 (H) 63.9 - 53.9 %   MCV 88.6 80.0 - 100.0 fL   MCH 30.6 26.0 - 34.0 pg   MCHC 34.5 30.0 - 36.0 g/dL   RDW 87.0 88.4 - 84.4 %   Platelets 312 150 - 400 K/uL   nRBC 0.0 0.0 - 0.2 %   Neutrophils Relative % 73 %   Neutro Abs 13.4 (H) 1.7 - 7.7 K/uL   Lymphocytes Relative 21 %   Lymphs Abs 3.8 0.7 - 4.0 K/uL   Monocytes Relative 5 %   Monocytes Absolute 0.9 0.1 - 1.0 K/uL   Eosinophils Relative 0 %  Eosinophils Absolute 0.1 0.0 - 0.5 K/uL   Basophils Relative 1 %   Basophils  Absolute 0.1 0.0 - 0.1 K/uL   Immature Granulocytes 0 %   Abs Immature Granulocytes 0.07 0.00 - 0.07 K/uL  Comprehensive metabolic panel     Status: Abnormal   Collection Time: 05/18/24 10:46 PM  Result Value Ref Range   Sodium 134 (L) 135 - 145 mmol/L   Potassium 3.4 (L) 3.5 - 5.1 mmol/L   Chloride 101 98 - 111 mmol/L   CO2 18 (L) 22 - 32 mmol/L   Glucose, Bld 79 70 - 99 mg/dL   BUN 6 6 - 20 mg/dL   Creatinine, Ser 9.10 0.44 - 1.00 mg/dL   Calcium 9.3 8.9 - 89.6 mg/dL   Total Protein 7.6 6.5 - 8.1 g/dL   Albumin 3.9 3.5 - 5.0 g/dL   AST 33 15 - 41 U/L   ALT 34 0 - 44 U/L   Alkaline Phosphatase 51 38 - 126 U/L   Total Bilirubin 1.9 (H) 0.0 - 1.2 mg/dL   GFR, Estimated >39 >39 mL/min   Anion gap 15 5 - 15    MDM Start IV LR Bolus MVI Antiemetic PPI Prescription Assessment and Plan  28 year old, [id number]  SIUP at 15 weeks N/V  -Reviewed POC with patient. -Exam performed and findings discussed.  -Collect labs as ordered. -Start IV with LR Bolus f/b MVI. -Give Zofran  IV f/b phenergan . -Give Pepcid . -POC discussed with patient who is agreeable. -No questions or concerns. -Will observe and reassess for readiness of PO challenge.   Harlene LITTIE Duncans 05/18/2024, 10:14 PM   Reassessment (12:26 AM) -Patient reports improvement with interventions thus far.  -Has been able to take small sips of sprite. -MVI just arrived from pharmacy.  Will give and plan to discharge. -Discussed usage of zofran  prn, but importance of speaking with primary ob regarding long term management solutions. -Patient agreeable and without questions.   Reassessment (1:40 AM) -MVI with ~563mL remaining.  -Patient without questions.  -Rx for Zofran  sent to pharmacy on file.  -Encouraged to call primary office or return to MAU if symptoms worsen or with the onset of new symptoms. -Discharged to home in improved condition once fluids complete.  Harlene LITTIE Duncans MSN, CNM Advanced Practice Provider,  [facility] [employer]

## 2024-05-18 NOTE — MAU Note (Signed)
 Pt says vomiting today . Went to [place] on Monday - gave meds through IV - and Rx  Shelah Derry  again to [facility] on Wed  for vomiting -gave Rx Phenergan - supp and pills . [address] [facility]- appointment - [facility] 05-21-2024

## 2024-05-19 DIAGNOSIS — Z3A15 15 weeks gestation of pregnancy: Secondary | ICD-10-CM

## 2024-05-19 DIAGNOSIS — O210 Mild hyperemesis gravidarum: Secondary | ICD-10-CM

## 2024-05-19 MED ORDER — ONDANSETRON 4 MG PO TBDP: 4.0000 mg | ORAL_TABLET | Freq: Four times a day (QID) | ORAL | 1 refills | Status: AC | PRN

## 2024-06-18 NOTE — Progress Notes (Signed)
 [id number] presenting at [redacted]w[redacted]d for a routine prenatal visit ---Patient examined and chart reviewed. ---  Complaints/concerns: none, she states she is  still  throwing up a lot, despite  taking  zofran  she  had hyperemesis  with her last phenergan  helped then but not with this one.  She keeping  down fluids some  crackers sometime  a sandwhich. She has tried has tried brat diet.  She is getting down sandwiches,  apples, edamame  She used to  drink a 12pack of  soda  now she can only drink  1 a day  She can't tolerate the  smell of  cooking meat She has stopped using  THC  She  states she is not having any  depression  Quickening: absent  No loss of fluid, vaginal bleeding, or pain. Patient is taking prenatal vitamins consistently.   Plan/Counseling Discussed screening with msAFP.  Explained what physiologic changes which would occur over the next several weeks. Discussed what to expect at her next visit including her anatomy ultrasound. Pain and bleeding precautions given. Patient given the opportunity to ask questions and all questions answered to the patient's satisfaction. Going on a  cruise will get  1gtt at 07/09/24  Documentation for time-based billing:  Total time spent of date of service was 15 minutes.  Patient care activities included preparing to see the patient such as reviewing the patient record, obtaining and/or reviewing separately obtained history, performing a medically appropriate history and physical examination, counseling and educating the patient, family, and/or caregiver, ordering prescription medications, tests, or procedures, referring and communicating with other health care providers when not separately reported during the visit, documenting clinical information in the electronic or other health record, and communicating results to the patient/family/caregiver.

## 2024-07-09 ENCOUNTER — Inpatient Hospital Stay (HOSPITAL_COMMUNITY)
Admission: AD | Admit: 2024-07-09 | Discharge: 2024-07-10 | Disposition: A | Discharge status: Home / Self Care | Payer: MEDICAID | Attending: Family Medicine | Admitting: Family Medicine

## 2024-07-09 DIAGNOSIS — O99282 Endocrine, nutritional and metabolic diseases complicating pregnancy, second trimester: Secondary | ICD-10-CM | POA: Diagnosis not present

## 2024-07-09 DIAGNOSIS — O210 Mild hyperemesis gravidarum: Secondary | ICD-10-CM | POA: Insufficient documentation

## 2024-07-09 DIAGNOSIS — F1290 Cannabis use, unspecified, uncomplicated: Secondary | ICD-10-CM | POA: Insufficient documentation

## 2024-07-09 DIAGNOSIS — E860 Dehydration: Secondary | ICD-10-CM | POA: Insufficient documentation

## 2024-07-09 DIAGNOSIS — Z3A22 22 weeks gestation of pregnancy: Secondary | ICD-10-CM | POA: Diagnosis not present

## 2024-07-09 DIAGNOSIS — O211 Hyperemesis gravidarum with metabolic disturbance: Secondary | ICD-10-CM | POA: Diagnosis not present

## 2024-07-09 DIAGNOSIS — O99322 Drug use complicating pregnancy, second trimester: Secondary | ICD-10-CM | POA: Insufficient documentation

## 2024-07-09 DIAGNOSIS — F12188 Cannabis abuse with other cannabis-induced disorder: Secondary | ICD-10-CM

## 2024-07-09 LAB — URINALYSIS, ROUTINE W REFLEX MICROSCOPIC
Bacteria, UA: NONE SEEN
Bilirubin Urine: NEGATIVE
Glucose, UA: 50 mg/dL — AB
Ketones, ur: 80 mg/dL — AB
Leukocytes,Ua: NEGATIVE
Nitrite: NEGATIVE
Protein, ur: >=300 mg/dL — AB
Specific Gravity, Urine: 1.028 (ref 1.005–1.030)
pH: 5 (ref 5.0–8.0)

## 2024-07-09 LAB — CBC
HCT: 43.3 % (ref 36.0–46.0)
Hemoglobin: 14.7 g/dL (ref 12.0–15.0)
MCH: 30.3 pg (ref 26.0–34.0)
MCHC: 33.9 g/dL (ref 30.0–36.0)
MCV: 89.3 fL (ref 80.0–100.0)
Platelets: 378 K/uL (ref 150–400)
RBC: 4.85 MIL/uL (ref 3.87–5.11)
RDW: 13.2 % (ref 11.5–15.5)
WBC: 21.3 K/uL — ABNORMAL HIGH (ref 4.0–10.5)
nRBC: 0 % (ref 0.0–0.2)

## 2024-07-09 LAB — COMPREHENSIVE METABOLIC PANEL WITH GFR
ALT: 22 U/L (ref 0–44)
AST: 22 U/L (ref 15–41)
Albumin: 3.4 g/dL — ABNORMAL LOW (ref 3.5–5.0)
Alkaline Phosphatase: 71 U/L (ref 38–126)
Anion gap: 15 (ref 5–15)
BUN: 7 mg/dL (ref 6–20)
CO2: 20 mmol/L — ABNORMAL LOW (ref 22–32)
Calcium: 9.2 mg/dL (ref 8.9–10.3)
Chloride: 102 mmol/L (ref 98–111)
Creatinine, Ser: 0.75 mg/dL (ref 0.44–1.00)
GFR, Estimated: >60 mL/min (ref >60)
Glucose, Bld: 148 mg/dL — ABNORMAL HIGH (ref 70–99)
Potassium: 3.4 mmol/L — ABNORMAL LOW (ref 3.5–5.1)
Sodium: 137 mmol/L (ref 135–145)
Total Bilirubin: 0.5 mg/dL (ref 0.0–1.2)
Total Protein: 7.2 g/dL (ref 6.5–8.1)

## 2024-07-09 LAB — MAGNESIUM: Magnesium: 1.9 mg/dL (ref 1.7–2.4)

## 2024-07-09 MED ORDER — ONDANSETRON HCL 4 MG/2ML IJ SOLN
4.0000 mg | Freq: Once | INTRAMUSCULAR | Status: AC
  Administered 2024-07-09 (×2): 4 mg via INTRAVENOUS
  Filled 2024-07-09: qty 2

## 2024-07-09 MED ORDER — SCOPOLAMINE 1 MG/3DAYS TD PT72
1.0000 | MEDICATED_PATCH | Freq: Once | TRANSDERMAL | Status: DC
  Administered 2024-07-09 (×2): 1.5 mg via TRANSDERMAL
  Filled 2024-07-09: qty 1

## 2024-07-09 MED ORDER — DIPHENHYDRAMINE HCL 50 MG/ML IJ SOLN
12.5000 mg | Freq: Once | INTRAMUSCULAR | Status: DC
  Filled 2024-07-09: qty 1

## 2024-07-09 MED ORDER — PROMETHAZINE HCL 25 MG PO TABS: 25.0000 mg | ORAL_TABLET | Freq: Four times a day (QID) | ORAL | 0 refills | Status: AC | PRN

## 2024-07-09 MED ORDER — SODIUM CHLORIDE 0.9 % IV SOLN
12.5000 mg | Freq: Once | INTRAVENOUS | Status: AC
  Administered 2024-07-09 (×2): 12.5 mg via INTRAVENOUS
  Filled 2024-07-09: qty 12.5

## 2024-07-09 MED ORDER — METOCLOPRAMIDE HCL 10 MG PO TABS: 10.0000 mg | ORAL_TABLET | Freq: Three times a day (TID) | ORAL | 1 refills | Status: AC

## 2024-07-09 MED ORDER — DIPHENHYDRAMINE HCL 25 MG PO CAPS: 25.0000 mg | ORAL_CAPSULE | Freq: Four times a day (QID) | ORAL | 1 refills | Status: AC

## 2024-07-09 MED ORDER — SENNA 8.6 MG PO TABS: 1.0000 | ORAL_TABLET | Freq: Every evening | ORAL | 0 refills | Status: AC | PRN

## 2024-07-09 MED ORDER — THIAMINE MONONITRATE 250 MG PO TABS: 100.0000 mg | ORAL_TABLET | Freq: Every day | ORAL | 1 refills | Status: AC

## 2024-07-09 MED ORDER — SCOPOLAMINE 1 MG/3DAYS TD PT72: 1.0000 | MEDICATED_PATCH | TRANSDERMAL | 0 refills | Status: AC

## 2024-07-09 MED ORDER — PROMETHAZINE HCL 25 MG RE SUPP: 25.0000 mg | Freq: Four times a day (QID) | RECTAL | 0 refills | Status: AC | PRN

## 2024-07-09 MED ORDER — LACTATED RINGERS IV BOLUS
1000.0000 mL | Freq: Once | INTRAVENOUS | Status: AC
  Administered 2024-07-09 (×2): 1000 mL via INTRAVENOUS

## 2024-07-09 MED ORDER — METOCLOPRAMIDE HCL 5 MG/ML IJ SOLN
10.0000 mg | Freq: Once | INTRAMUSCULAR | Status: AC
  Administered 2024-07-09 (×2): 10 mg via INTRAVENOUS
  Filled 2024-07-09: qty 2

## 2024-07-09 NOTE — MAU Note (Signed)
 MAU Triage Note:  .Ashley Harvey is a 28 y.o. at [redacted]w[redacted]d here in MAU reporting: hasn't been able to keep anything for the past 3 days. Last took zofran  around 1300. Reports dx of hyperemesis. Patient actively vomiting in triage. Denies VB or abnormal discharge. Reports +FM.  Patient complaint: vomiting  Pain Score: 0-No pain     Onset of complaint: 3 days ago LMP: Patient's last menstrual period was 02/03/2024.  Vitals:   07/09/24 1955  BP: 131/80  Pulse: 77  Resp: 18  Temp: 98.3 F (36.8 C)  SpO2: 99%    FHT:  Fetal Heart Rate Mode: Doppler Baseline Rate (A): 152 bpm Multiple birth?: No Lab orders placed from triage: UA

## 2024-07-09 NOTE — MAU Provider Note (Addendum)
 History     CSN: [id number]  Arrival date and time: 07/09/24 1928    Chief Complaint  Patient presents with   Nausea   Emesis   Emesis   [name] is a 28 y.o. year old G67P1021 female at [redacted]w[redacted]d weeks gestation w/ known history of hyperemesis gravidarum who presents to MAU reporting 3 days of intense nausea and vomiting.   She reports 3 days of cyclic vomiting and is unable to tolerate any PO intake. Endorses weakness all over. Denies VB. Reports +FM. Patient does use marijuana regularly. Has zofran  at home; largely unhelpful. No blood in vomit. Lost 20 lbs during pregnancy.  PNC at [facility] health. Denies VB, LOF, change in discharge, urinary symptoms, DFM.  OB History     Gravida  4   Para  1   Term  1   Preterm      AB  2   Living  1      SAB  2   IAB  0   Ectopic  0   Multiple  0   Live Births  1           Past Medical History:  Diagnosis Date   Seizures (HCC)    [redacted] weeks pregnant; stress induced, most recent one @ 28 wks   Strep throat     Past Surgical History:  Procedure Laterality Date   dnc     NO PAST SURGERIES      Family History  Problem Relation Age of Onset   Cancer Mother    Hyperlipidemia Father    Hypertension Father    Diabetes Father    Cancer Maternal Grandmother    Cancer Maternal Grandfather    Diabetes Paternal Grandmother    Hypertension Paternal Grandmother    Hypertension Paternal Grandfather    Cancer Maternal Aunt    Cancer Maternal Uncle     Social History   Tobacco Use   Smoking status: Every Day    Types: E-cigarettes   Smokeless tobacco: Never  Vaping Use   Vaping status: Former   Quit date: 05/13/2024   Substances: Nicotine, Flavoring  Substance Use Topics   Alcohol use: No   Drug use: Not Currently    Frequency: 7.0 times per week    Types: Marijuana    Comment: last use @ 6wks preg    Allergies:  Allergies  Allergen Reactions   Vancomycin Anaphylaxis    No medications  prior to admission.    Review of Systems  Gastrointestinal:  Positive for nausea and vomiting.  Neurological:  Positive for weakness.   Physical Exam   Blood pressure 129/80, pulse (!) 101, temperature 98.5 F (36.9 C), temperature source Oral, resp. rate 20, height 5' 2 (1.575 m), weight 95.3 kg, last menstrual period 02/03/2024, SpO2 96%.  Physical Exam Constitutional:      Appearance: She is ill-appearing.  Eyes:     Conjunctiva/sclera: Conjunctivae normal.  Cardiovascular:     Rate and Rhythm: Regular rhythm. Tachycardia present.  Pulmonary:     Comments: Increased work of breathing Abdominal:     General: Bowel sounds are normal.     Tenderness: There is no abdominal tenderness. There is no guarding or rebound. Negative signs include Murphy's sign.     Comments: Gravid  Musculoskeletal:        General: No swelling.     Cervical back: Normal range of motion.  Skin:    General: Skin is warm and dry.  Neurological:     General: No focal deficit present.     Mental Status: She is alert.  Psychiatric:        Mood and Affect: Mood normal.    MAU Course  Procedures  MDM: Will obtain CBC, CMP, magnesium, UA, EKG. Borderline QTC interval. Elevated WBC. K 3.4. UA without infection. 80 ketones. 1 L LR For treatment, IV Zofran , IV Phenergan , IV Reglan , Scopolamine  Patch. Patient initially feeling much improved, however started to vomit again. Benadryl  given and continued to vomit. Then disclosed she uses cannabis. Haldol  and additional bolus given with marked improvement.   Results for orders placed or performed during the hospital encounter of 07/09/24 (from the past 24 hours)  CBC     Status: Abnormal   Collection Time: 07/09/24  8:49 PM  Result Value Ref Range   WBC 21.3 (H) 4.0 - 10.5 K/uL   RBC 4.85 3.87 - 5.11 MIL/uL   Hemoglobin 14.7 12.0 - 15.0 g/dL   HCT 56.6 63.9 - 53.9 %   MCV 89.3 80.0 - 100.0 fL   MCH 30.3 26.0 - 34.0 pg   MCHC 33.9 30.0 - 36.0 g/dL    RDW 86.7 88.4 - 84.4 %   Platelets 378 150 - 400 K/uL   nRBC 0.0 0.0 - 0.2 %  Comprehensive metabolic panel     Status: Abnormal   Collection Time: 07/09/24  8:49 PM  Result Value Ref Range   Sodium 137 135 - 145 mmol/L   Potassium 3.4 (L) 3.5 - 5.1 mmol/L   Chloride 102 98 - 111 mmol/L   CO2 20 (L) 22 - 32 mmol/L   Glucose, Bld 148 (H) 70 - 99 mg/dL   BUN 7 6 - 20 mg/dL   Creatinine, Ser 9.24 0.44 - 1.00 mg/dL   Calcium 9.2 8.9 - 89.6 mg/dL   Total Protein 7.2 6.5 - 8.1 g/dL   Albumin 3.4 (L) 3.5 - 5.0 g/dL   AST 22 15 - 41 U/L   ALT 22 0 - 44 U/L   Alkaline Phosphatase 71 38 - 126 U/L   Total Bilirubin 0.5 0.0 - 1.2 mg/dL   GFR, Estimated >39 >39 mL/min   Anion gap 15 5 - 15  Magnesium     Status: None   Collection Time: 07/09/24  8:49 PM  Result Value Ref Range   Magnesium 1.9 1.7 - 2.4 mg/dL  Urinalysis, Routine w reflex microscopic -Urine, Clean Catch     Status: Abnormal   Collection Time: 07/09/24  9:59 PM  Result Value Ref Range   Color, Urine Harvey (A) YELLOW   APPearance CLOUDY (A) CLEAR   Specific Gravity, Urine 1.028 1.005 - 1.030   pH 5.0 5.0 - 8.0   Glucose, UA 50 (A) NEGATIVE mg/dL   Hgb urine dipstick SMALL (A) NEGATIVE   Bilirubin Urine NEGATIVE NEGATIVE   Ketones, ur 80 (A) NEGATIVE mg/dL   Protein, ur >=699 (A) NEGATIVE mg/dL   Nitrite NEGATIVE NEGATIVE   Leukocytes,Ua NEGATIVE NEGATIVE   RBC / HPF 0-5 0 - 5 RBC/hpf   WBC, UA 0-5 0 - 5 WBC/hpf   Bacteria, UA NONE SEEN NONE SEEN   Squamous Epithelial / HPF 11-20 0 - 5 /HPF   Mucus PRESENT    Hyaline Casts, UA PRESENT    Granular Casts, UA PRESENT     Assessment and Plan   Exacerbation of hyperemesis gravidarum/cannabis hyperemesis  - Dehydrated. No major electrolyte derangements - Benign abdomen - Advised  cessation of cannabis for improvement of hyperemesis - Extensive home regimen given: haldol , benadryl , phenergan , reglan , scopolamine  patch. Advised to use haldol  sparingly - Rehydrated and  able to keep down a snack prior to discharge  [redacted] weeks gestation  Normal dopplers. +FM.  This is a Psychologist, occupational Note.  The care of the patient was discussed with Dr. Nicholaus and the assessment and plan formulated with their assistance.  Please see their attached note for official documentation of the daily encounter.  Ashley Harvey, MS3   LOS: 0 days   Attestation of Supervision of Student:  I confirm that I have verified the information documented in the medical student's note and that I have also personally reperformed the history, physical exam and all medical decision making activities.  I have verified that all services and findings are accurately documented in this student's note; and I agree with management and plan as outlined in the documentation. I have also made any necessary editorial changes.  Ashley CHRISTELLA Nicholaus, MD OB Fellow 07/10/2024 5:17 AM

## 2024-07-09 NOTE — Discharge Instructions (Addendum)
 Please check out [email or web address] for more info on hyperemesis and support.  For going home: - Everyday take your prenatal vitamin and thiamine  - Haldol  helps most with vomiting from cannabis. Use this three times per day as needed. Best to use sparingly during pregnancy - Benadryl  every 6 hours (can be every 4 if needed) - Reglan  three times per day - Scopolamine  patch gets switched every 3 days - Zofran , phenergan  as needed - Goal is 1 bowel movement per day. Can take senna daily as needed

## 2024-07-10 MED ORDER — HALOPERIDOL 1 MG PO TABS: 1.0000 mg | ORAL_TABLET | Freq: Three times a day (TID) | ORAL | 2 refills | Status: AC | PRN

## 2024-07-10 MED ORDER — DIPHENHYDRAMINE HCL 50 MG/ML IJ SOLN
12.5000 mg | Freq: Once | INTRAMUSCULAR | Status: AC
  Administered 2024-07-10 (×2): 12.5 mg via INTRAMUSCULAR

## 2024-07-10 MED ORDER — LACTATED RINGERS IV BOLUS
1000.0000 mL | Freq: Once | INTRAVENOUS | Status: AC
  Administered 2024-07-10 (×2): 1000 mL via INTRAVENOUS

## 2024-07-10 MED ORDER — HALOPERIDOL LACTATE 5 MG/ML IJ SOLN
2.5000 mg | Freq: Once | INTRAMUSCULAR | Status: AC
  Administered 2024-07-10 (×2): 2.5 mg via INTRAVENOUS
  Filled 2024-07-10: qty 1

## 2024-07-10 MED ORDER — HALOPERIDOL LACTATE 5 MG/ML IJ SOLN: 5.0000 mg | Freq: Once | INTRAMUSCULAR | Status: DC

## 2024-07-11 NOTE — ED Notes (Signed)
 This RN in room to give pt ginger ale for PO challenge and pt is really nauseous again. Provider notified.

## 2024-07-11 NOTE — ED Notes (Signed)
 Pt now asleep.

## 2024-07-11 NOTE — ED Notes (Signed)
Pt had vomiting episode.

## 2024-07-12 ENCOUNTER — Encounter (HOSPITAL_COMMUNITY): Payer: Self-pay | Admitting: Obstetrics & Gynecology

## 2024-07-12 ENCOUNTER — Inpatient Hospital Stay (HOSPITAL_COMMUNITY)
Admission: AD | Admit: 2024-07-12 | Discharge: 2024-07-13 | Disposition: A | Discharge status: Home / Self Care | Payer: MEDICAID | Attending: Obstetrics & Gynecology | Admitting: Obstetrics & Gynecology

## 2024-07-12 ENCOUNTER — Other Ambulatory Visit: Payer: Self-pay

## 2024-07-12 DIAGNOSIS — F1210 Cannabis abuse, uncomplicated: Secondary | ICD-10-CM | POA: Insufficient documentation

## 2024-07-12 DIAGNOSIS — Z3A23 23 weeks gestation of pregnancy: Secondary | ICD-10-CM | POA: Insufficient documentation

## 2024-07-12 DIAGNOSIS — F12188 Cannabis abuse with other cannabis-induced disorder: Secondary | ICD-10-CM

## 2024-07-12 DIAGNOSIS — O211 Hyperemesis gravidarum with metabolic disturbance: Secondary | ICD-10-CM | POA: Insufficient documentation

## 2024-07-12 DIAGNOSIS — O99322 Drug use complicating pregnancy, second trimester: Secondary | ICD-10-CM | POA: Insufficient documentation

## 2024-07-12 DIAGNOSIS — E876 Hypokalemia: Secondary | ICD-10-CM

## 2024-07-12 NOTE — ED Notes (Signed)
 PO challenged done for this pt. Pt is able to tolerate water.

## 2024-07-12 NOTE — MAU Note (Signed)
 Ashley Harvey is a 28 y.o. at [redacted]w[redacted]d here in MAU reporting: pt states she is 17 weeks with EDD of 12/17/2024. Unable to keep anything down for 5 days and feels weak. Reports sore throat and coughing up blood. Has prescription for nausea meds - not working. Reports taking zofran , phenergan , and scope patch.   LMP: NA Onset of complaint: 5 days  Pain score: 10 Vitals:   07/12/24 2338  BP: 129/82  Pulse: 82  Resp: 16  Temp: 97.6 F (36.4 C)  SpO2: 98%     FHT: 154  Lab orders placed from triage: UA

## 2024-07-13 ENCOUNTER — Other Ambulatory Visit (HOSPITAL_COMMUNITY): Payer: Self-pay

## 2024-07-13 DIAGNOSIS — O211 Hyperemesis gravidarum with metabolic disturbance: Secondary | ICD-10-CM

## 2024-07-13 DIAGNOSIS — F1210 Cannabis abuse, uncomplicated: Secondary | ICD-10-CM | POA: Diagnosis not present

## 2024-07-13 DIAGNOSIS — Z3A23 23 weeks gestation of pregnancy: Secondary | ICD-10-CM

## 2024-07-13 DIAGNOSIS — O99322 Drug use complicating pregnancy, second trimester: Secondary | ICD-10-CM | POA: Diagnosis not present

## 2024-07-13 MED ORDER — METHYLPREDNISOLONE SODIUM SUCC 125 MG IJ SOLR
48.0000 mg | Freq: Once | INTRAMUSCULAR | Status: AC
  Administered 2024-07-13: 48 mg via INTRAVENOUS
  Filled 2024-07-13: qty 2

## 2024-07-13 MED ORDER — MIRTAZAPINE 15 MG PO TBDP
15.0000 mg | ORAL_TABLET | Freq: Once | ORAL | Status: AC
  Administered 2024-07-13: 15 mg via ORAL
  Filled 2024-07-13: qty 1

## 2024-07-13 MED ORDER — LACTATED RINGERS IV SOLN: INTRAVENOUS | Status: DC

## 2024-07-13 MED ORDER — LACTATED RINGERS IV BOLUS
1000.0000 mL | Freq: Once | INTRAVENOUS | Status: AC
  Administered 2024-07-13: 1000 mL via INTRAVENOUS

## 2024-07-13 MED ORDER — PANTOPRAZOLE SODIUM 40 MG IV SOLR
40.0000 mg | Freq: Once | INTRAVENOUS | Status: AC
Start: 2024-07-13 — End: 2024-07-13
  Administered 2024-07-13: 40 mg via INTRAVENOUS
  Filled 2024-07-13: qty 10

## 2024-07-13 MED ORDER — METHYLPREDNISOLONE 4 MG PO TABS
ORAL_TABLET | ORAL | 0 refills | Status: AC
Start: 2024-07-13 — End: ?
  Filled 2024-07-13: qty 81, 14d supply, fill #0

## 2024-07-13 MED ORDER — PROMETHAZINE HCL 25 MG/ML IJ SOLN
25.0000 mg | Freq: Once | INTRAMUSCULAR | Status: AC
  Administered 2024-07-13: 25 mg via INTRAVENOUS
  Filled 2024-07-13: qty 1

## 2024-07-13 MED ORDER — POTASSIUM CHLORIDE 2 MEQ/ML IV SOLN
Freq: Once | INTRAVENOUS | Status: AC
  Filled 2024-07-13: qty 1000

## 2024-07-13 MED ORDER — ONDANSETRON HCL 4 MG/2ML IJ SOLN
4.0000 mg | Freq: Once | INTRAMUSCULAR | Status: AC
  Administered 2024-07-13: 4 mg via INTRAVENOUS
  Filled 2024-07-13: qty 2

## 2024-07-13 NOTE — Progress Notes (Signed)
 Patient has received 3 1000ml bags of fluid. Pt states she still does not have the urge to urinate. Pt bladder scanned and 1st scan showed 45ml, repeat bladder scan showed 55ml. CANDIE Rote CNM notified.

## 2024-07-13 NOTE — MAU Provider Note (Cosign Needed Addendum)
 History     CSN: [id number]  Arrival date and time: 07/12/24 2321   Event Date/Time   First Provider Initiated Contact with Patient 07/13/24 0018      Chief Complaint  Patient presents with   Nausea   Emesis    Ashley Harvey is a 28 y.o. G4P1021 at [redacted]w[redacted]d who receives care at [facility].  She reports her next appt is either the 16th or 18th. She presents today for hyperemesis.  She states she is [redacted] weeks pregnant and has hyperemesis and is really sick.  She reports taking medication at 1600 and took phenergan .  She states it did not help. She reports that she has tried diclegis, zofran , and has on patch with no relief.  She reports she has thrown up a whole trash can full today.  She states she has not eating, but has been swishing fluid around her mouth.   OB History     Gravida  4   Para  1   Term  1   Preterm      AB  2   Living  1      SAB  2   IAB  0   Ectopic  0   Multiple  0   Live Births  1           Past Medical History:  Diagnosis Date   Seizures (HCC)    [redacted] weeks pregnant; stress induced, most recent one @ 28 wks   Strep throat     Past Surgical History:  Procedure Laterality Date   dnc     NO PAST SURGERIES      Family History  Problem Relation Age of Onset   Cancer Mother    Hyperlipidemia Father    Hypertension Father    Diabetes Father    Cancer Maternal Grandmother    Cancer Maternal Grandfather    Diabetes Paternal Grandmother    Hypertension Paternal Grandmother    Hypertension Paternal Grandfather    Cancer Maternal Aunt    Cancer Maternal Uncle     Social History   Tobacco Use   Smoking status: Former    Types: E-cigarettes   Smokeless tobacco: Never  Vaping Use   Vaping status: Former   Quit date: 05/13/2024   Substances: Nicotine, Flavoring  Substance Use Topics   Alcohol use: No   Drug use: Not Currently    Frequency: 7.0 times per week    Types: Marijuana    Comment: last use @ 6wks  preg    Allergies:  Allergies  Allergen Reactions   Vancomycin Anaphylaxis    Medications Prior to Admission  Medication Sig Dispense Refill Last Dose/Taking   ondansetron  (ZOFRAN -ODT) 4 MG disintegrating tablet Take 1-2 tablets (4-8 mg total) by mouth every 6 (six) hours as needed for nausea or vomiting. 20 tablet 1 07/12/2024 at  2:00 PM   promethazine  (PHENERGAN ) 25 MG tablet Take 1 tablet (25 mg total) by mouth every 6 (six) hours as needed for nausea or vomiting. 30 tablet 0 07/12/2024 at  4:00 PM   scopolamine  (TRANSDERM-SCOP) 1 MG/3DAYS Place 1 patch (1.5 mg total) onto the skin every 3 (three) days. 30 patch 0 07/12/2024   albuterol  (VENTOLIN  HFA) 108 (90 Base) MCG/ACT inhaler Inhale 2 puffs into the lungs every 6 (six) hours as needed for wheezing or shortness of breath. 54 g 0    budesonide-formoterol (SYMBICORT ) 80-4.5 MCG/ACT inhaler Inhale 2 puffs into the lungs 2 (two) times daily.  10.2 g 0    diphenhydrAMINE  (BENADRYL ) 25 mg capsule Take 1 capsule (25 mg total) by mouth every 6 (six) hours. 90 capsule 1    haloperidol  (HALDOL ) 1 MG tablet Take 1 tablet (1 mg total) by mouth every 8 (eight) hours as needed (Vomiting). 90 tablet 2    metoCLOPramide  (REGLAN ) 10 MG tablet Take 1 tablet (10 mg total) by mouth 3 (three) times daily with meals. 90 tablet 1    promethazine  (PHENERGAN ) 25 MG suppository Place 1 suppository (25 mg total) rectally every 6 (six) hours as needed for nausea or vomiting. 12 each 0    senna (SENOKOT) 8.6 MG TABS tablet Take 1 tablet (8.6 mg total) by mouth at bedtime as needed for mild constipation. 180 tablet 0    sertraline (ZOLOFT) 50 MG tablet Take 50 mg by mouth daily.      thiamine  250 MG TABS Take 100 mg by mouth daily. 90 tablet 1     Review of Systems  Constitutional:  Negative for chills and fever.  Gastrointestinal:  Positive for nausea and vomiting. Negative for abdominal pain, constipation and diarrhea.  Genitourinary:  Negative for difficulty  urinating, dysuria, vaginal bleeding and vaginal discharge.  Neurological:  Negative for dizziness, light-headedness and headaches.   Physical Exam   Blood pressure 139/88, pulse 93, temperature 97.8 F (36.6 C), temperature source Axillary, resp. rate 15, height 5' 2 (1.575 m), weight 94.5 kg, last menstrual period 02/03/2024, SpO2 98%.  Physical Exam Vitals and nursing note reviewed.  Constitutional:      General: She is not in acute distress.    Appearance: Normal appearance. She is ill-appearing.  HENT:     Head: Normocephalic and atraumatic.  Eyes:     Conjunctiva/sclera: Conjunctivae normal.  Cardiovascular:     Rate and Rhythm: Normal rate.  Pulmonary:     Effort: Pulmonary effort is normal. No respiratory distress.  Musculoskeletal:        General: Normal range of motion.     Cervical back: Normal range of motion.  Skin:    General: Skin is warm and dry.  Neurological:     Mental Status: She is alert and oriented to person, place, and time.  Psychiatric:        Mood and Affect: Mood normal.        Behavior: Behavior normal.     MAU Course  Procedures No results found for this or any previous visit (from the past 24 hours).  MDM Lab: UA Start IV LR Bolus Antiemetic Steroid Taper Assessment and Plan  28 year old, G4P1021  SIUP at 22.6 weeks Hyperemesis Hypokalemia  -Reviewed POC with patient. -Exam performed.  -Previous labs reviewed and concerning for hypokalemia.  -Start IV with LR Bolus -Give Zofran  IV. -Give MVI with potassium 10mEq. -Will also start medrol  taper IV and plan to discharge home with pills for oral administration. -POC discussed with patient who is agreeable. -No questions or concerns. -UA ordered, but patient unable to urinate.   Harlene Duncans, CNM [phone]  Reassessment (2:16 AM) -Nurse reports IV placed by IV team. -Will start fluids and medications as ordered. -Patient still unable to urinate.  -Monitor and  reassess.  Reassessment (4:44 AM) -S. Warren-Hill, CNM assumes care of patient.  Reassessment (7:54 AM) Additional attempts to control nausea with phenergan  and remeron , with minimal success. Patient sleeping but reports nausea on awaking. Will add protonix . Patient has received approximately 3000mL of fluid without a subsequent void. Bladder scan  conducted by RN staff per CNM request with volumes <100 noted x2. Minimal emesis reported in several hours.  Camie DELENA Rote MSN, CNM Advanced Practice Provider, [facility] for [facility] of patient turned over to Olam Boards, MSN, CNM at [phone]   MDM:  Pt did void after 3 liters of IV fluids.  After Protonix  IV, pt slept for ~ 1 hour and when she woke up she reported feeling better.  Discussed plan for home management and Rx sent for steroid taper to pharmacy (changed previous CNM's orders to reflex insurance coverage for 4 mg tablets only).  Pt to continue scopolamine  patch, other antiemetics, and haldol  PO PRN for n/v and cannabis hyperemesis. Pt encouraged to discontinue any THC use as this may be contributing to cyclic vomiting. Pt states understanding.  Keep prenatal appointments as scheduled. Return to MAU with emergencies.  A/P:  1. Hyperemesis gravidarum with electrolyte imbalance   2. Hypokalemia   3. [redacted] weeks gestation of pregnancy   4. Cannabis hyperemesis syndrome concurrent with and due to cannabis abuse (HCC)      D/C home with return precautions  Olam Boards, CNM 10:05 PM

## 2024-07-13 NOTE — Discharge Instructions (Addendum)
  MEDROL  (METHYLPREDNISOLONE ) TAPER  FOR HYPEREMESIS GRAVIDARUM PATIENTS   Date Day Morning Midday Bedtime  Friday  07/14/2024 1 16 mg 16 mg 16 mg  Saturday  07/15/2024 2 16 mg 16 mg 16 mg  Sunday  07/16/2024 3 16 mg 16 mg 16 mg  Monday  07/17/2024 4 16 mg 8 mg 16 mg  Tuesday  07/18/2024 5 16 mg 8 mg 8 mg  Wednesday 07/19/2024 6 8 mg 8 mg 8 mg  Thursday  07/20/2024 7 8 mg 4 mg 8 mg  Friday  07/21/2024 8 8 mg 4 mg 4 mg  Saturday  07/22/2024 9 8 mg 4 mg   Sunday 07/23/2024 10 8 mg 4 mg   Monday 07/24/2024 11 8 mg    Tuesday 07/25/2024 12 8 mg    Wednesday 07/26/2024 13 4 mg    Thursday 07/27/2024 14 4 mg

## 2024-07-13 NOTE — Progress Notes (Signed)
 Pt sleeping upon entering the room.

## 2024-07-15 ENCOUNTER — Emergency Department (HOSPITAL_COMMUNITY)
Admission: AD | Admit: 2024-07-15 | Discharge: 2024-07-16 | Disposition: A | Discharge status: Home / Self Care | Payer: MEDICAID | Attending: Obstetrics and Gynecology | Admitting: Obstetrics and Gynecology

## 2024-07-15 DIAGNOSIS — S0990XA Unspecified injury of head, initial encounter: Secondary | ICD-10-CM | POA: Diagnosis not present

## 2024-07-15 DIAGNOSIS — O99282 Endocrine, nutritional and metabolic diseases complicating pregnancy, second trimester: Secondary | ICD-10-CM | POA: Insufficient documentation

## 2024-07-15 DIAGNOSIS — D72829 Elevated white blood cell count, unspecified: Secondary | ICD-10-CM | POA: Insufficient documentation

## 2024-07-15 DIAGNOSIS — R55 Syncope and collapse: Secondary | ICD-10-CM | POA: Diagnosis not present

## 2024-07-15 DIAGNOSIS — W19XXXA Unspecified fall, initial encounter: Secondary | ICD-10-CM | POA: Diagnosis not present

## 2024-07-15 DIAGNOSIS — Z3A17 17 weeks gestation of pregnancy: Secondary | ICD-10-CM | POA: Diagnosis not present

## 2024-07-15 DIAGNOSIS — O99112 Other diseases of the blood and blood-forming organs and certain disorders involving the immune mechanism complicating pregnancy, second trimester: Secondary | ICD-10-CM | POA: Insufficient documentation

## 2024-07-15 DIAGNOSIS — O9A212 Injury, poisoning and certain other consequences of external causes complicating pregnancy, second trimester: Secondary | ICD-10-CM | POA: Diagnosis not present

## 2024-07-15 DIAGNOSIS — E860 Dehydration: Secondary | ICD-10-CM | POA: Insufficient documentation

## 2024-07-15 DIAGNOSIS — O219 Vomiting of pregnancy, unspecified: Secondary | ICD-10-CM | POA: Insufficient documentation

## 2024-07-15 DIAGNOSIS — O26892 Other specified pregnancy related conditions, second trimester: Secondary | ICD-10-CM | POA: Diagnosis not present

## 2024-07-15 NOTE — MAU Note (Signed)
 MAU Triage Note:  .Ashley Harvey is a 28 y.o. at [redacted]w[redacted]d here in MAU reporting: has not been able to keep anything down for the last 8-10 days. Reports constant nausea. Last had a phenergan  suppository at 1600 today. Last smoked marijuana 4 days ago. No active vomiting during triage. Patient's partner reports that he witnessed patient fall earlier today after hearing a loud noise and finding her on the ground. Patient reports pain on the right side of her head from the fall but reports less FM since. Patient does not remember the fall. Denies VB or abnormal discharge.   Patient complaint: pt fell, hyperemesis, nausea,dfm  Pain Score: 7  Pain Location: Head     Onset of complaint: today LMP: Patient's last menstrual period was 02/03/2024.  Vitals:   07/15/24 2354  BP: 124/80  Pulse: (!) 102  Resp: 16  Temp: 99 F (37.2 C)  SpO2: 99%    FHT:  Fetal Heart Rate Mode: Doppler Baseline Rate (A): 150 bpm Multiple birth?: No Lab orders placed from triage: UA

## 2024-07-16 ENCOUNTER — Encounter (HOSPITAL_COMMUNITY): Payer: Self-pay | Admitting: Obstetrics and Gynecology

## 2024-07-16 ENCOUNTER — Other Ambulatory Visit: Payer: Self-pay

## 2024-07-16 DIAGNOSIS — S0990XA Unspecified injury of head, initial encounter: Secondary | ICD-10-CM

## 2024-07-16 DIAGNOSIS — Z3A17 17 weeks gestation of pregnancy: Secondary | ICD-10-CM

## 2024-07-16 DIAGNOSIS — O26892 Other specified pregnancy related conditions, second trimester: Secondary | ICD-10-CM

## 2024-07-16 LAB — COMPREHENSIVE METABOLIC PANEL WITH GFR
ALT: 99 U/L — ABNORMAL HIGH (ref 0–44)
AST: 50 U/L — ABNORMAL HIGH (ref 15–41)
Albumin: 2.9 g/dL — ABNORMAL LOW (ref 3.5–5.0)
Alkaline Phosphatase: 70 U/L (ref 38–126)
Anion gap: 13 (ref 5–15)
BUN: 6 mg/dL (ref 6–20)
CO2: 16 mmol/L — ABNORMAL LOW (ref 22–32)
Calcium: 8.7 mg/dL — ABNORMAL LOW (ref 8.9–10.3)
Chloride: 104 mmol/L (ref 98–111)
Creatinine, Ser: 0.55 mg/dL (ref 0.44–1.00)
GFR, Estimated: >60 mL/min (ref >60)
Glucose, Bld: 89 mg/dL (ref 70–99)
Potassium: 3.8 mmol/L (ref 3.5–5.1)
Sodium: 133 mmol/L — ABNORMAL LOW (ref 135–145)
Total Bilirubin: 1.4 mg/dL — ABNORMAL HIGH (ref 0.0–1.2)
Total Protein: 6.3 g/dL — ABNORMAL LOW (ref 6.5–8.1)

## 2024-07-16 LAB — CBC WITH DIFFERENTIAL/PLATELET
Abs Immature Granulocytes: 0.08 K/uL — ABNORMAL HIGH (ref 0.00–0.07)
Basophils Absolute: 0.1 K/uL (ref 0.0–0.1)
Basophils Relative: 0 %
Eosinophils Absolute: 0.1 K/uL (ref 0.0–0.5)
Eosinophils Relative: 1 %
HCT: 44.5 % (ref 36.0–46.0)
Hemoglobin: 15.4 g/dL — ABNORMAL HIGH (ref 12.0–15.0)
Immature Granulocytes: 1 %
Lymphocytes Relative: 19 %
Lymphs Abs: 3.3 K/uL (ref 0.7–4.0)
MCH: 31.2 pg (ref 26.0–34.0)
MCHC: 34.6 g/dL (ref 30.0–36.0)
MCV: 90.3 fL (ref 80.0–100.0)
Monocytes Absolute: 1.1 K/uL — ABNORMAL HIGH (ref 0.1–1.0)
Monocytes Relative: 6 %
Neutro Abs: 12.8 K/uL — ABNORMAL HIGH (ref 1.7–7.7)
Neutrophils Relative %: 73 %
Platelets: 303 K/uL (ref 150–400)
RBC: 4.93 MIL/uL (ref 3.87–5.11)
RDW: 13.3 % (ref 11.5–15.5)
WBC: 17.3 K/uL — ABNORMAL HIGH (ref 4.0–10.5)
nRBC: 0 % (ref 0.0–0.2)

## 2024-07-16 LAB — MAGNESIUM: Magnesium: 1.9 mg/dL (ref 1.7–2.4)

## 2024-07-16 MED ORDER — ONDANSETRON HCL 4 MG/2ML IJ SOLN
4.0000 mg | Freq: Once | INTRAMUSCULAR | Status: AC
  Administered 2024-07-16: 4 mg via INTRAVENOUS
  Filled 2024-07-16: qty 2

## 2024-07-16 MED ORDER — SODIUM CHLORIDE 0.9 % IV BOLUS (SEPSIS)
1000.0000 mL | Freq: Once | INTRAVENOUS | Status: AC
  Administered 2024-07-16: 1000 mL via INTRAVENOUS

## 2024-07-16 MED ORDER — LACTATED RINGERS IV BOLUS
1000.0000 mL | Freq: Once | INTRAVENOUS | Status: AC
  Administered 2024-07-16: 1000 mL via INTRAVENOUS

## 2024-07-16 NOTE — ED Provider Notes (Signed)
 [place] EMERGENCY DEPARTMENT AT [facility] Provider Note   CSN: [id number] Arrival date & time: 07/15/24  2334     Patient presents with: Emesis   Ashley Harvey is a 28 y.o. female.   The history is provided by the patient and a significant other.  Patient is currently [redacted] weeks pregnant has been struggling with hyperemesis gravidarum.  Over the past several days she has had multiple episodes of nausea vomiting but denies any abdominal pain, no vaginal bleeding. Her significant other reports that yesterday he was in another room and he heard her fall, and was found facedown on the floor.  No seizures activity.  When he tried to pick her up she started waking up.  She reports she has had syncopal episodes previously with vomiting Currently she reports mild headache and sore throat from vomiting.  No active chest pain or shortness of breath.  No abdominal pain. No focal arm or leg weakness.  No neck or back pain Denies any known history of underlying cardiac disease, no history of PE    Patient already been seen by OB/GYN tonight and has been cleared, fetal heart tones were noted Past Medical History:  Diagnosis Date   Seizures (HCC)    [redacted] weeks pregnant; stress induced, most recent one @ 28 wks   Strep throat     Prior to Admission medications   Medication Sig Start Date End Date Taking? Authorizing Provider  albuterol  (VENTOLIN  HFA) 108 (90 Base) MCG/ACT inhaler Inhale 2 puffs into the lungs every 6 (six) hours as needed for wheezing or shortness of breath. 04/06/24   Pauline Garnette LABOR, MD  budesonide-formoterol (SYMBICORT ) 80-4.5 MCG/ACT inhaler Inhale 2 puffs into the lungs 2 (two) times daily. 04/06/24   Pauline Garnette LABOR, MD  diphenhydrAMINE  (BENADRYL ) 25 mg capsule Take 1 capsule (25 mg total) by mouth every 6 (six) hours. 07/09/24   Nicholaus Almarie HERO, MD  haloperidol  (HALDOL ) 1 MG tablet Take 1 tablet (1 mg total) by mouth every 8 (eight) hours as needed (Vomiting).  07/10/24   Nicholaus Almarie HERO, MD  methylPREDNISolone  (MEDROL ) 4 MG tablet Day 1, 2, and 3: Take 4 tablets at breakfast lunch and dinner. Day 4: Take 4 at breakfast, 2 at lunch, and 4 at dinner. Take 5: Take 4 at breakfast, 2 at lunch, and 2 at dinner.  Day 6: Take 2 at breakfast, lunch and dinner. Day 7: Take 2 at breakfast, 1 at lunch, 2 at dinner.  Day 8: Take 2 at breakfast, 1 at lunch, 1 at dinner. Day 9 and 10: Take 2 at breakfast, 1 at lunch.  Day 11 and 12: Take 2 at breakfast. Day 13 and 14: Take 1 at breakfast. 07/13/24   Leftwich-Kirby, Olam LABOR, CNM  metoCLOPramide  (REGLAN ) 10 MG tablet Take 1 tablet (10 mg total) by mouth 3 (three) times daily with meals. 07/09/24 07/09/25  Nicholaus Almarie HERO, MD  ondansetron  (ZOFRAN -ODT) 4 MG disintegrating tablet Take 1-2 tablets (4-8 mg total) by mouth every 6 (six) hours as needed for nausea or vomiting. 05/19/24   Synthia Raisin, CNM  promethazine  (PHENERGAN ) 25 MG suppository Place 1 suppository (25 mg total) rectally every 6 (six) hours as needed for nausea or vomiting. 07/09/24   Nicholaus Almarie HERO, MD  promethazine  (PHENERGAN ) 25 MG tablet Take 1 tablet (25 mg total) by mouth every 6 (six) hours as needed for nausea or vomiting. 07/09/24   Nicholaus Almarie HERO, MD  scopolamine  (TRANSDERM-SCOP) 1 MG/3DAYS Place  1 patch (1.5 mg total) onto the skin every 3 (three) days. 07/09/24   Nicholaus Almarie HERO, MD  senna (SENOKOT) 8.6 MG TABS tablet Take 1 tablet (8.6 mg total) by mouth at bedtime as needed for mild constipation. 07/09/24   Nicholaus Almarie HERO, MD  sertraline (ZOLOFT) 50 MG tablet Take 50 mg by mouth daily.    [provider]  thiamine  250 MG TABS Take 100 mg by mouth daily. 07/09/24   Nicholaus Almarie HERO, MD    Allergies: Vancomycin    Review of Systems  Constitutional:  Negative for fever.  Gastrointestinal:  Positive for nausea and vomiting. Negative for abdominal pain.  Genitourinary:  Negative for vaginal bleeding.  Musculoskeletal:  Negative  for back pain and neck pain.  Neurological:  Positive for syncope and headaches.    Updated Vital Signs BP 136/81   Pulse 83   Temp 98.2 F (36.8 C) (Oral)   Resp 16   Ht 1.575 m (5' 2)   Wt 90.7 kg   LMP 02/03/2024   SpO2 97%   BMI 36.58 kg/m   Physical Exam CONSTITUTIONAL: Well developed/well nourished HEAD: Normocephalic/atraumatic, no evidence of trauma EYES: EOMI/PERRL ENMT: Mucous membranes moist, no oral or nasal trauma noted, face stable NECK: supple no meningeal signs SPINE/BACK:entire spine nontender No bruising/crepitance/stepoffs noted to spine CV: S1/S2 noted, no murmurs/rubs/gallops noted LUNGS: Lungs are clear to auscultation bilaterally, no apparent distress ABDOMEN: soft, nontender, no rebound or guarding, bowel sounds noted throughout abdomen GU:no cva tenderness NEURO: Pt is awake/alert/appropriate, moves all extremitiesx4.  No facial droop.  No arm or leg drift EXTREMITIES: pulses normal/equal, full ROM, no deformities SKIN: warm, color normal PSYCH: no abnormalities of mood noted, alert and oriented to situation  (all labs ordered are listed, but only abnormal results are displayed) Labs Reviewed  CBC WITH DIFFERENTIAL/PLATELET - Abnormal; Notable for the following components:      Result Value   WBC 17.3 (*)    Hemoglobin 15.4 (*)    Neutro Abs 12.8 (*)    Monocytes Absolute 1.1 (*)    Abs Immature Granulocytes 0.08 (*)    All other components within normal limits  COMPREHENSIVE METABOLIC PANEL WITH GFR - Abnormal; Notable for the following components:   Sodium 133 (*)    CO2 16 (*)    Calcium 8.7 (*)    Total Protein 6.3 (*)    Albumin 2.9 (*)    AST 50 (*)    ALT 99 (*)    Total Bilirubin 1.4 (*)    All other components within normal limits  MAGNESIUM    EKG: ED ECG REPORT   Date: 07/16/2024 0023  Rate: 105  Rhythm: sinus tachycardia  QRS Axis: right  Intervals: normal  ST/T Wave abnormalities: nonspecific ST changes   Conduction Disutrbances:none  Narrative Interpretation:   Old EKG Reviewed: unchanged  I have personally reviewed the EKG tracing and agree with the computerized printout as noted.   Radiology: No results found.   Procedures   Medications Ordered in the ED  sodium chloride  0.9 % bolus 1,000 mL (0 mLs Intravenous Stopped 07/16/24 0339)  ondansetron  (ZOFRAN ) injection 4 mg (4 mg Intravenous Given 07/16/24 0219)  lactated ringers  bolus 1,000 mL (1,000 mLs Intravenous New Bag/Given 07/16/24 0340)  ondansetron  (ZOFRAN ) injection 4 mg (4 mg Intravenous Given 07/16/24 0349)    Clinical Course as of 07/16/24 0518  Mon Jul 16, 2024  0206 Patient is currently pregnant and struggling with hyperemesis gravidarum  that is likely exacerbated by her underlying marijuana use.  She has had multiple evaluations by OB/GYN.  Significant other reports the patient had a syncopal episode at home but there was no seizure activity.  Patient was initially seen tonight by OB/GYN who has cleared her from an obstetric standpoint has been sent for further evaluation of her syncopal episode.  Offered CT head the patient but she declined.  Will check labs, rehydrate and reassess Patient is overall low risk for emergent cause of syncope [DW]  0338 Overall patient reports feeling improved. Labs do reveal dehydration with a bicarb of 16 She has leukocytosis but this appears improved Will give a second liter of fluid, and she is requesting Zofran .  Patient has been taking multiple antiemetics at home including Zofran , promethazine , scopolamine , haloperidol  with minimal relief.  She reports she just stopped smoking marijuana 4 days ago which should help her symptoms [DW]  0518 Patient feeling improved.  She is walking around in no distress.  She is requesting discharge home.  I suspect her syncopal episode was related to her underlying dehydration and frequent episodes of vomiting.  No evidence of any cardiac dysrhythmia here  in the ER. Patient safe for discharge [DW]    Clinical Course User Index [DW] Midge Golas, MD            Glasgow Coma Scale Score: 15       NEXUS Criteria Score: 0                Medical Decision Making Amount and/or Complexity of Data Reviewed Labs: ordered.  Risk Prescription drug management.   This patient presents to the ED for concern of syncope, this involves an extensive number of treatment options, and is a complaint that carries with it a high risk of complications and morbidity.  The differential diagnosis includes but is not limited to cardiac dysrhythmia, pulmonary embolism, seizures, orthostatic hypotension, vasovagal syncope, dehydration, electrolyte imbalance  Comorbidities that complicate the patient evaluation: Patient's presentation is complicated by their history of pregnancy  Social Determinants of Health: Patient's marijuana use  increases the complexity of managing their presentation  Additional history obtained: Additional history obtained from significant other Records reviewed OB/GYN notes reviewed  Lab Tests: I Ordered, and personally interpreted labs.  The pertinent results include: Dehydration   Cardiac Monitoring: The patient was maintained on a cardiac monitor.  I personally viewed and interpreted the cardiac monitor which showed an underlying rhythm of:  sinus rhythm  Medicines ordered and prescription drug management: I ordered medication including Zofran  for nausea Reevaluation of the patient after these medicines showed that the patient    improved  Test Considered: CT head without contrast was discussed with patient.  She would prefer to avoid CT imaging.  Since it has been several hours since the fall, GCS of 15 and no mental status changes, will defer CT head for now  Critical Interventions:   IV fluid   Reevaluation: After the interventions noted above, I reevaluated the patient and found that they have  :improved  Complexity of problems addressed: Patient's presentation is most consistent with  acute presentation with potential threat to life or bodily function  Disposition: After consideration of the diagnostic results and the patient's response to treatment,  I feel that the patent would benefit from discharge  .        Final diagnoses:  Traumatic injury of head, initial encounter  [redacted] weeks gestation of pregnancy  Syncope and collapse  ED Discharge Orders     None          Midge Golas, MD 07/16/24 [phone]

## 2024-07-16 NOTE — ED Triage Notes (Signed)
 Pt is [redacted] weeks pregnant and has been vomiting for last 9 days.

## 2024-07-16 NOTE — MAU Provider Note (Signed)
   S [name] is a 28 y.o. [phone] patient who presents to MAU today with complaint of patient is 17-week weeks 6 days here in the MAU reporting inability to tolerate p.o. food or fluids for the last 8 to 10 days.  She has had continuous hyperemesis and has seen been seen here and treated for this on numerous occasions.  Patient also has a history of marijuana use reported last use 4 days ago.  No active vomiting in triage.  Patient's partner reported that between 9:45 PM and 10 PM this evening he found her on the floor passed out.  Patient does not have any recollection of this fall but does report severe pain on the right side of her face.  She offers no OB complaints.  Denies any vaginal bleeding, leaking of fluid, abdominal cramping/contractions   O BP 124/80 (BP Location: Right Arm)   Pulse (!) 102   Temp 99 F (37.2 C) (Oral)   Resp 16   Ht 5' 2 (1.575 m)   Wt 90.7 kg   LMP 02/03/2024   SpO2 99%   BMI 36.58 kg/m  Physical Exam Vitals and nursing note reviewed.  Constitutional:      Appearance: She is ill-appearing.  Cardiovascular:     Rate and Rhythm: Normal rate.  Pulmonary:     Effort: Pulmonary effort is normal.  Abdominal:     Palpations: Abdomen is soft.  Musculoskeletal:     Comments: Patient in wheelchair unable to stand   Skin:    General: Skin is warm.  Neurological:     Mental Status: She is disoriented.    MDM  LOW- > ED for transfer of care due to head trauma   Prenatal chart reviewed Patient is  status post fall with head trauma between 9:45 PM and 10 PM per partner at the bedside contributing to the HPI Patient had LOC and is unaware of the event when asked Phone call placed to the ED Dr. Midge who has accepted  transfer of patient for evaluation of trauma to the head with LOC.  Patient cleared obstetrically with fetal heart tones at 150   ASSESSMENT Medical screening exam complete Stable for transfer to ED      PLAN Transferred to ED via Wheelchair by CNA Ashley Harvey, Ashley Harvey LABOR, NP 07/16/2024 12:06 AM

## 2024-07-16 NOTE — MAU Note (Signed)
 Olam Dalton, NP in triage to assess patient. Patient transferred to main ED via wheelchair, accompanied by partner and staff.

## 2024-07-16 NOTE — ED Notes (Signed)
 Pt tolerated p.o. fluids and crackers and was able to ambulate without assistance. Pt states she is ready for discharge.Provider aware
